# Patient Record
Sex: Female | Born: 1980 | Race: Black or African American | Hispanic: No | Marital: Married | Smoking: Never smoker
Health system: Southern US, Community
[De-identification: ages and names within clinical notes are randomized; demographics above are authoritative.]

## PROBLEM LIST (undated history)

## (undated) DIAGNOSIS — M722 Plantar fascial fibromatosis: Secondary | ICD-10-CM

## (undated) DIAGNOSIS — H539 Unspecified visual disturbance: Secondary | ICD-10-CM

## (undated) DIAGNOSIS — R519 Headache, unspecified: Secondary | ICD-10-CM

## (undated) DIAGNOSIS — R51 Headache: Secondary | ICD-10-CM

## (undated) DIAGNOSIS — G35 Multiple sclerosis: Secondary | ICD-10-CM

## (undated) HISTORY — PX: OTHER SURGICAL HISTORY: SHX169

## (undated) HISTORY — PX: FINGER SURGERY: SHX640

## (undated) HISTORY — DX: Headache, unspecified: R51.9

## (undated) HISTORY — PX: GASTRIC BYPASS: SHX52

## (undated) HISTORY — DX: Unspecified visual disturbance: H53.9

## (undated) HISTORY — DX: Headache: R51

## (undated) HISTORY — DX: Multiple sclerosis: G35

---

## 2007-07-27 HISTORY — PX: TUBAL LIGATION: SHX77

## 2016-10-06 ENCOUNTER — Other Ambulatory Visit: Payer: Self-pay | Admitting: Nurse Practitioner

## 2016-10-06 DIAGNOSIS — N63 Unspecified lump in unspecified breast: Secondary | ICD-10-CM

## 2016-10-11 ENCOUNTER — Other Ambulatory Visit: Payer: Self-pay

## 2016-12-16 ENCOUNTER — Other Ambulatory Visit: Payer: Self-pay | Admitting: Family

## 2016-12-16 ENCOUNTER — Other Ambulatory Visit: Payer: Self-pay | Admitting: Nurse Practitioner

## 2016-12-16 DIAGNOSIS — N63 Unspecified lump in unspecified breast: Secondary | ICD-10-CM

## 2016-12-21 ENCOUNTER — Other Ambulatory Visit: Payer: Self-pay

## 2016-12-21 ENCOUNTER — Ambulatory Visit
Admission: RE | Admit: 2016-12-21 | Discharge: 2016-12-21 | Disposition: A | Discharge status: Home / Self Care | Payer: Medicaid Other | Source: Ambulatory Visit | Attending: Family | Admitting: Family

## 2016-12-21 DIAGNOSIS — N63 Unspecified lump in unspecified breast: Secondary | ICD-10-CM

## 2017-05-19 ENCOUNTER — Encounter (HOSPITAL_COMMUNITY): Payer: Self-pay | Admitting: *Deleted

## 2017-05-19 ENCOUNTER — Emergency Department (HOSPITAL_COMMUNITY)
Admission: EM | Admit: 2017-05-19 | Discharge: 2017-05-19 | Disposition: A | Discharge status: Home / Self Care | Payer: Medicaid Other | Attending: Emergency Medicine | Admitting: Emergency Medicine

## 2017-05-19 ENCOUNTER — Emergency Department (HOSPITAL_COMMUNITY): Payer: Medicaid Other

## 2017-05-19 DIAGNOSIS — R200 Anesthesia of skin: Secondary | ICD-10-CM | POA: Diagnosis present

## 2017-05-19 DIAGNOSIS — R202 Paresthesia of skin: Secondary | ICD-10-CM | POA: Diagnosis not present

## 2017-05-19 DIAGNOSIS — R9389 Abnormal findings on diagnostic imaging of other specified body structures: Secondary | ICD-10-CM | POA: Insufficient documentation

## 2017-05-19 HISTORY — DX: Plantar fascial fibromatosis: M72.2

## 2017-05-19 LAB — COMPREHENSIVE METABOLIC PANEL
ALK PHOS: 53 U/L (ref 38–126)
ALT: 10 U/L — ABNORMAL LOW (ref 14–54)
ANION GAP: 6 (ref 5–15)
AST: 14 U/L — ABNORMAL LOW (ref 15–41)
Albumin: 3.9 g/dL (ref 3.5–5.0)
BILIRUBIN TOTAL: 0.7 mg/dL (ref 0.3–1.2)
BUN: 17 mg/dL (ref 6–20)
CALCIUM: 9.2 mg/dL (ref 8.9–10.3)
CO2: 24 mmol/L (ref 22–32)
Chloride: 110 mmol/L (ref 101–111)
Creatinine, Ser: 0.89 mg/dL (ref 0.44–1.00)
GFR calc non Af Amer: >60 mL/min (ref >60)
GLUCOSE: 83 mg/dL (ref 65–99)
Potassium: 3.9 mmol/L (ref 3.5–5.1)
Sodium: 140 mmol/L (ref 135–145)
Total Protein: 7.5 g/dL (ref 6.5–8.1)

## 2017-05-19 LAB — CBC
HEMATOCRIT: 36.1 % (ref 36.0–46.0)
HEMOGLOBIN: 12.3 g/dL (ref 12.0–15.0)
MCH: 30.8 pg (ref 26.0–34.0)
MCHC: 34.1 g/dL (ref 30.0–36.0)
MCV: 90.3 fL (ref 78.0–100.0)
Platelets: 272 10*3/uL (ref 150–400)
RBC: 4 MIL/uL (ref 3.87–5.11)
RDW: 12.5 % (ref 11.5–15.5)
WBC: 5.5 10*3/uL (ref 4.0–10.5)

## 2017-05-19 MED ORDER — PREDNISONE 50 MG PO TABS: 200.0000 mg | ORAL_TABLET | Freq: Every day | ORAL | 0 refills | Status: AC

## 2017-05-19 MED ORDER — GADOBENATE DIMEGLUMINE 529 MG/ML IV SOLN
20.0000 mL | Freq: Once | INTRAVENOUS | Status: AC
  Administered 2017-05-19: 20 mL via INTRAVENOUS

## 2017-05-19 MED ORDER — PREDNISONE 20 MG PO TABS
200.0000 mg | ORAL_TABLET | ORAL | Status: AC
  Administered 2017-05-19: 200 mg via ORAL
  Filled 2017-05-19: qty 10

## 2017-05-19 NOTE — ED Triage Notes (Signed)
To ED for eval of right side of face and tongue numb since Monday. Feels same today as it when started Monday. Pt has no noted neuro deficits. Does complain of generalized slight HA that started this am. Extremities all strong. Speech is clear. No tongue swelling noted

## 2017-05-19 NOTE — Discharge Instructions (Signed)
As discussed, your abnormal MRI suggests the possibility of multiple sclerosis. It is very important to take all medication as directed, and be sure to follow-up with our neurologist.  Return here for concerning changes in your condition.

## 2017-05-19 NOTE — ED Provider Notes (Signed)
[facility] EMERGENCY DEPARTMENT Provider Note   CSN: [id number] Arrival date & time: 05/19/17  0848     History   Chief Complaint Chief Complaint  Patient presents with  . Numbness    HPI Haley Mitchell is a 36 y.o. female.  Patient c/o right face and right tongue numbness for the past 3 days. Symptoms persistent, constant. No specific exacerbating or alleviating factors. Also feels as if her sense of taste on right side of tongue also affected.  Denies facial pain or weakness, no droop. No ear pain or change in hearing. No headaches. No neck pain. Denies hx same symptoms. No change in speech or vision. No extremity numbness/weakness, or change in normal functional ability.    The history is provided by the patient.    Past Medical History:  Diagnosis Date  . Plantar fasciitis     There are no active problems to display for this patient.   Past Surgical History:  Procedure Laterality Date  . c section    . GASTRIC BYPASS      OB History    No data available       Home Medications    Prior to Admission medications   Medication Sig Start Date End Date Taking? Authorizing Provider  ibuprofen (ADVIL,MOTRIN) 800 MG tablet Take 800 mg by mouth every 8 (eight) hours as needed for mild pain.   Yes [provider]    Family History No family history on file.  Social History Social History  Substance Use Topics  . Smoking status: Not on file  . Smokeless tobacco: Not on file  . Alcohol use Not on file     Allergies   Patient has no known allergies.   Review of Systems Review of Systems  Constitutional: Negative for fever.  HENT: Negative for trouble swallowing.   Eyes: Negative for visual disturbance.  Respiratory: Negative for shortness of breath.   Cardiovascular: Negative for chest pain.  Gastrointestinal: Negative for abdominal pain.  Genitourinary: Negative for flank pain.  Musculoskeletal: Negative for neck pain.    Skin: Negative for rash.  Neurological: Negative for headaches.  Hematological: Does not bruise/bleed easily.  Psychiatric/Behavioral: Negative for confusion.     Physical Exam Updated Vital Signs BP 139/88   Pulse 64   Temp 98.1 F (36.7 C) (Oral)   Resp 13   Ht 1.524 m (5')   Wt 103.9 kg (229 lb)   SpO2 100%   BMI 44.72 kg/m   Physical Exam  Constitutional: She is oriented to person, place, and time. She appears well-developed and well-nourished. No distress.  HENT:  Right Ear: External ear normal.  Left Ear: External ear normal.  Mouth/Throat: Oropharynx is clear and moist.  Eyes: Pupils are equal, round, and reactive to light. Conjunctivae and EOM are normal. No scleral icterus.  Neck: Neck supple. No tracheal deviation present.  No bruits.   Cardiovascular: Normal rate, regular rhythm, normal heart sounds and intact distal pulses.  Exam reveals no gallop and no friction rub.   No murmur heard. Pulmonary/Chest: Effort normal and breath sounds normal. No respiratory distress.  Abdominal: Soft. Normal appearance and bowel sounds are normal. She exhibits no distension. There is no tenderness.  Musculoskeletal: She exhibits no edema.  Neurological: She is alert and oriented to person, place, and time. No cranial nerve deficit.  No facial asymmetry or droop. No cr n deficit noted. No pronator drift. Motor 5/5 bil. sens grossly intact. Steady gait.  Skin: Skin is warm and dry. No rash noted. She is not diaphoretic.  Psychiatric: She has a normal mood and affect.  Nursing note and vitals reviewed.    ED Treatments / Results  Labs (all labs ordered are listed, but only abnormal results are displayed) Results for orders placed or performed during the hospital encounter of 05/19/17  CBC  Result Value Ref Range   WBC 5.5 4.0 - 10.5 K/uL   RBC 4.00 3.87 - 5.11 MIL/uL   Hemoglobin 12.3 12.0 - 15.0 g/dL   HCT 16.136.1 09.636.0 - 04.546.0 %   MCV 90.3 78.0 - 100.0 fL   MCH 30.8 26.0  - 34.0 pg   MCHC 34.1 30.0 - 36.0 g/dL   RDW 40.912.5 81.111.5 - 91.415.5 %   Platelets 272 150 - 400 K/uL  Comprehensive metabolic panel  Result Value Ref Range   Sodium 140 135 - 145 mmol/L   Potassium 3.9 3.5 - 5.1 mmol/L   Chloride 110 101 - 111 mmol/L   CO2 24 22 - 32 mmol/L   Glucose, Bld 83 65 - 99 mg/dL   BUN 17 6 - 20 mg/dL   Creatinine, Ser 7.820.89 0.44 - 1.00 mg/dL   Calcium 9.2 8.9 - 95.610.3 mg/dL   Total Protein 7.5 6.5 - 8.1 g/dL   Albumin 3.9 3.5 - 5.0 g/dL   AST 14 (L) 15 - 41 U/L   ALT 10 (L) 14 - 54 U/L   Alkaline Phosphatase 53 38 - 126 U/L   Total Bilirubin 0.7 0.3 - 1.2 mg/dL   GFR calc non Af Amer >60 >60 mL/min   GFR calc Af Amer >60 >60 mL/min   Anion gap 6 5 - 15    EKG  EKG Interpretation None       Radiology No results found.  Procedures Procedures (including critical care time)  Medications Ordered in ED Medications - No data to display   Initial Impression / Assessment and Plan / ED Course  I have reviewed the triage vital signs and the nursing notes.  Pertinent labs & imaging results that were available during my care of the patient were reviewed by me and considered in my medical decision making (see chart for details).  Labs sent from triage.   Neurology consulted.   Reviewed nursing notes and prior charts for additional history.   Neurology consulted - discussed pt with Dr Amada JupiterKirkpatrick - he recommends getting MRI with/without, and if ok, to have patient f/u with neurology as outpatient.  Pt informed of plan.  Pt will be signed out to Dr Jeraldine LootsLockwood to check MRI when back.  Final Clinical Impressions(s) / ED Diagnoses   Final diagnoses:  None    New Prescriptions New Prescriptions   No medications on file     Cathren LaineSteinl, Dmario Russom, MD 05/19/17 1528

## 2017-05-19 NOTE — ED Provider Notes (Signed)
I reviewed the patient's MRI results with our neurologist, and then with the patient. She is awake and alert, appreciates the information. No new complaints. She will be started on steroids, per neurology recommendation, follow-up in the clinic.    Gerhard Munch, MD 05/19/17 [phone]

## 2017-05-19 NOTE — ED Notes (Signed)
Patient transported to MRI 

## 2017-06-21 ENCOUNTER — Other Ambulatory Visit: Payer: Self-pay

## 2017-06-21 ENCOUNTER — Encounter: Payer: Self-pay | Admitting: Neurology

## 2017-06-21 ENCOUNTER — Encounter (INDEPENDENT_AMBULATORY_CARE_PROVIDER_SITE_OTHER): Payer: Self-pay

## 2017-06-21 ENCOUNTER — Ambulatory Visit: Payer: Medicaid Other | Admitting: Neurology

## 2017-06-21 VITALS — BP 151/95 | HR 72 | Resp 18 | Ht 60.0 in | Wt 238.0 lb

## 2017-06-21 DIAGNOSIS — R9089 Other abnormal findings on diagnostic imaging of central nervous system: Secondary | ICD-10-CM | POA: Diagnosis not present

## 2017-06-21 DIAGNOSIS — Z9884 Bariatric surgery status: Secondary | ICD-10-CM

## 2017-06-21 DIAGNOSIS — R200 Anesthesia of skin: Secondary | ICD-10-CM | POA: Diagnosis not present

## 2017-06-21 DIAGNOSIS — G2581 Restless legs syndrome: Secondary | ICD-10-CM | POA: Insufficient documentation

## 2017-06-21 MED ORDER — GABAPENTIN 600 MG PO TABS: ORAL_TABLET | ORAL | 5 refills | Status: DC

## 2017-06-21 NOTE — Progress Notes (Signed)
[facility]  PATIENT: Haley Mitchell DOB: Sep 16, 1980  REFERRING DOCTOR OR PCP:  Catalina Pizzaynthia Warden, FNP SOURCE: Patient, notes from Ms. Myrtie SomanWarden and ED,  MRI report and Haley SkillernMRI images on PACS.  _________________________________   HISTORICAL  CHIEF COMPLAINT:  Chief Complaint  Patient presents with  . Numbness    Haley Mitchell is here with her husband Reita ClicheBobby for eval of right sided facial, tongue, lip numbness onset in October.  Lasted about 10 days and resolved after course of oral steroids.  Numbness has not returned. Also c/o intermittent tingling bilat legs (left worse than right) onset 2 yrs. ago, only noted when her legs are still, such as in bed at night.  Tingling improves with movement/fim    HISTORY OF PRESENT ILLNESS:  I had the pleasure of seeing you patient, Haley Mitchell, at the [facility] for neurologic consultation regarding her recent episode of facial numbness and her abnormal MRI worrisome for multiple sclerosis.  Around 05/16/2017, she woke up with numbness and side of the face, tongue and lip. She presented to the emergency room 05/19/2017. There was no weakness but words were slightly slurred.    No numbness, weakness or clumsiness elsewhere.    An MRI of the brain, that I personally reviewed, was performed showing about a half dozen T2/FLAIR hyperintense foci, 3 of which were periventricular, radially oriented to the ventricles.   None of the foci enhanced after contrast.   She was prescribed prednisone pills. Over the next week the numbness resolved. She does not have any facial numbness now.  In retrospect, about 2 years ago, while she was living in [place], she had the onset of numbness and stiffness with spasms in the left leg. At that time, there were no symptoms in the right leg. Over a couple weeks the symptoms improved but she continues to get some intermittent numbness. More recently she has noted numbness and  restlessness in her legs when she lays down at night. If she moves around the symptoms improve.  In 2017, she had the vertical sleeve bariatric operation. She lost 65 pounds quickly. Of note, the episode of numbness preceded her operation.   She notes no problems with her vision or her bladder.     She notes fatigue but this has been a problem x years.   She sleeps poorly some nights, sometimes because her legs are numb.   She denies any mood issues or problems with cognition.  She denies any family history of multiple sclerosis. Her mother has psoriasis.   REVIEW OF SYSTEMS: Constitutional: No fevers, chills, sweats, or change in appetite.  She has insomnia and probable restless leg syndrome. Eyes: No visual changes, double vision, eye pain Ear, nose and throat: No hearing loss, ear pain, nasal congestion, sore throat Cardiovascular: No chest pain, palpitations Respiratory: No shortness of breath at rest or with exertion.   No wheezes GastrointestinaI: No nausea, vomiting, diarrhea, abdominal pain, fecal incontinence Genitourinary: No dysuria, urinary retention or frequency.  No nocturia. Musculoskeletal: No neck pain, back pain Integumentary: No rash, pruritus, skin lesions Neurological: as above Psychiatric: No depression at this time.  No anxiety Endocrine: No palpitations, diaphoresis, change in appetite, change in weigh or increased thirst Hematologic/Lymphatic: No anemia, purpura, petechiae. Allergic/Immunologic: No itchy/runny eyes, nasal congestion, recent allergic reactions, rashes  ALLERGIES: No Known Allergies  HOME MEDICATIONS:  Current Outpatient Medications:  .  ibuprofen (ADVIL,MOTRIN) 800 MG tablet, Take 800 mg by mouth every 8 (eight)  hours as needed for mild pain., Disp: , Rfl:   PAST MEDICAL HISTORY: Past Medical History:  Diagnosis Date  . Plantar fasciitis     PAST SURGICAL HISTORY: Past Surgical History:  Procedure Laterality Date  . c section      . GASTRIC BYPASS      FAMILY HISTORY: History reviewed. No pertinent family history.  SOCIAL HISTORY:  Social History   Socioeconomic History  . Marital status: Married    Spouse name: Not on file  . Number of children: Not on file  . Years of education: Not on file  . Highest education level: Not on file  Social Needs  . Financial resource strain: Not on file  . Food insecurity - worry: Not on file  . Food insecurity - inability: Not on file  . Transportation needs - medical: Not on file  . Transportation needs - non-medical: Not on file  Occupational History  . Not on file  Tobacco Use  . Smoking status: Never Smoker  . Smokeless tobacco: Never Used  Substance and Sexual Activity  . Alcohol use: Yes    Comment: social  . Drug use: No  . Sexual activity: Not on file  Other Topics Concern  . Not on file  Social History Narrative  . Not on file     PHYSICAL EXAM  Vitals:   06/21/17 0922  BP: (!) 151/95  Pulse: 72  Resp: 18  Weight: 238 lb (108 kg)  Height: 5' (1.524 m)    Body mass index is 46.48 kg/m.   General: The patient is well-developed and well-nourished and in no acute distress  Eyes:  Funduscopic exam shows normal optic discs and retinal vessels.  Neck: The neck is supple, no carotid bruits are noted.  The neck is nontender.  Cardiovascular: The heart has a regular rate and rhythm with a normal S1 and S2. There were no murmurs, gallops or rubs. Lungs are clear to auscultation.  Skin: Extremities are without significant edema.  Musculoskeletal:  Back is nontender  Neurologic Exam  Mental status: The patient is alert and oriented x 3 at the time of the examination. The patient has apparent normal recent and remote memory, with an apparently normal attention span and concentration ability.   Speech is normal.  Cranial nerves: Extraocular movements are full. Pupils are equal, round, and reactive to light and accomodation.  Visual fields are  full.  Facial symmetry is present. There is good facial sensation to soft touch bilaterally.Facial strength is normal.  Trapezius and sternocleidomastoid strength is normal. No dysarthria is noted.  The tongue is midline, and the patient has symmetric elevation of the soft palate. No obvious hearing deficits are noted.  Motor:  Muscle bulk is normal.   Tone is normal. Strength is  5 / 5 in all 4 extremities.   Sensory: In the arms, she had symmetric sensation to touch, temperature and vibration. The legs, she had intact and symmetric sensation to touch and temperature. However, vibration sensation was altered on the left..  Coordination: Cerebellar testing reveals good finger-nose-finger and heel-to-shin bilaterally.  Gait and station: Station is normal.   Gait is normal. Tandem gait is normal. Romberg is negative.   Reflexes: Deep tendon reflexes are symmetric and normal bilaterally.   Plantar responses are flexor.    DIAGNOSTIC DATA (LABS, IMAGING, TESTING) - I reviewed patient records, labs, notes, testing and imaging myself where available.  Lab Results  Component Value Date   WBC 5.5  05/19/2017   HGB 12.3 05/19/2017   HCT 36.1 05/19/2017   MCV 90.3 05/19/2017   PLT 272 05/19/2017      Component Value Date/Time   NA 140 05/19/2017 0950   K 3.9 05/19/2017 0950   CL 110 05/19/2017 0950   CO2 24 05/19/2017 0950   GLUCOSE 83 05/19/2017 0950   BUN 17 05/19/2017 0950   CREATININE 0.89 05/19/2017 0950   CALCIUM 9.2 05/19/2017 0950   PROT 7.5 05/19/2017 0950   ALBUMIN 3.9 05/19/2017 0950   AST 14 (L) 05/19/2017 0950   ALT 10 (L) 05/19/2017 0950   ALKPHOS 53 05/19/2017 0950   BILITOT 0.7 05/19/2017 0950   GFRNONAA >60 05/19/2017 0950   GFRAA >60 05/19/2017 0950       ASSESSMENT AND PLAN  Facial numbness - Plan: Ferritin, Pan-ANCA  Left leg numbness - Plan: MR CERVICAL SPINE W WO CONTRAST, MR THORACIC SPINE W WO CONTRAST, Copper, serum, Vitamin B12, Pan-ANCA  Restless  leg - Plan: Ferritin  Abnormal brain MRI - Plan: MR CERVICAL SPINE W WO CONTRAST, MR THORACIC SPINE W WO CONTRAST, Pan-ANCA, CBC with Differential/Platelet  Status post bariatric surgery - Plan: Copper, serum, Vitamin B12, CBC with Differential/Platelet   In summary, Haley Mitchell is a 36 year old woman had a 7-10 day episode of a facial numbness a couple weeks ago and a 2 week episode of left leg numbness and stiffness 2 years ago who has an abnormal MRI that could be consistent with multiple sclerosis. There were no enhancing lesions. Therefore, we need to obtain more information to determine if she actually has multiple sclerosis. I will check an MRI of the cervical and thoracic spine. If she has plaques within the spinal cord, then we can be of the diagnosis. If the spinal MRI is negative, we will need to check a lumbar puncture to see if the CSF has oligoclonal bands. If present, then MS can also be diagnosed.  If both tests are negative, then we will recheck her MRI in about 9 months to see if she has changes over time consistent with MS. We will also check some blood work today to make sure that there is not a metabolic reason for her symptoms (she has had bariatric procedures).  She also appears to have restless leg syndrome and I will check a ferritin level and have her start gabapentin about an hour before bedtime.  She will return to see me in 6 weeks or sooner if there are new or worsening symptoms.   If the MRI of the spinal cord is consistent with MS, then I will bring her in sooner to initiate therapy   Abhijay Morriss A. Epimenio Foot, MD, [facility] 06/21/2017, 9:24 AM Certified in Neurology, Clinical Neurophysiology, Sleep Medicine, Pain Medicine and Neuroimaging  [facility] [address], [address] [place], [place] [address] [phone]

## 2017-06-22 LAB — CBC WITH DIFFERENTIAL/PLATELET
BASOS ABS: 0 10*3/uL (ref 0.0–0.2)
BASOS: 0 %
EOS (ABSOLUTE): 0.1 10*3/uL (ref 0.0–0.4)
EOS: 1 %
HEMATOCRIT: 39.5 % (ref 34.0–46.6)
HEMOGLOBIN: 12.8 g/dL (ref 11.1–15.9)
IMMATURE GRANS (ABS): 0 10*3/uL (ref 0.0–0.1)
Immature Granulocytes: 0 %
LYMPHS ABS: 2 10*3/uL (ref 0.7–3.1)
Lymphs: 34 %
MCH: 29.6 pg (ref 26.6–33.0)
MCHC: 32.4 g/dL (ref 31.5–35.7)
MCV: 91 fL (ref 79–97)
MONOCYTES: 5 %
Monocytes Absolute: 0.3 10*3/uL (ref 0.1–0.9)
Neutrophils Absolute: 3.5 10*3/uL (ref 1.4–7.0)
Neutrophils: 60 %
Platelets: 315 10*3/uL (ref 150–379)
RBC: 4.32 x10E6/uL (ref 3.77–5.28)
RDW: 13 % (ref 12.3–15.4)
WBC: 5.9 10*3/uL (ref 3.4–10.8)

## 2017-06-23 LAB — PAN-ANCA
Atypical pANCA: <1:20 {titer}
C-ANCA: <1:20 {titer}
P-ANCA: <1:20 {titer}

## 2017-06-23 LAB — VITAMIN B12: VITAMIN B 12: 853 pg/mL (ref 232–1245)

## 2017-06-23 LAB — FERRITIN: Ferritin: 31 ng/mL (ref 15–150)

## 2017-06-23 LAB — COPPER, SERUM: COPPER: 104 ug/dL (ref 72–166)

## 2017-06-24 ENCOUNTER — Telehealth: Payer: Self-pay | Admitting: *Deleted

## 2017-06-24 NOTE — Telephone Encounter (Signed)
LMOM that lab work done in our office is fine.  She does not need to return this call unless she has questions/fim 

## 2017-06-24 NOTE — Telephone Encounter (Signed)
-----   Message from Asa Lenteichard A Sater, MD sent at 06/23/2017  5:28 PM EST ----- Please let the patient know that the lab work is fine.

## 2017-06-27 ENCOUNTER — Telehealth: Payer: Self-pay | Admitting: Neurology

## 2017-06-27 NOTE — Telephone Encounter (Signed)
Pt called she said insurance has denied gabapentin (NEURONTIN) 600 MG tablet . She said preferred medication has not been tried and failed. She is wanting to what is the next step. Please call to advise

## 2017-06-28 NOTE — Telephone Encounter (Signed)
Per Medicaid, Gabapentin tablets were just added to their list of preferred medications on 06/27/17.  The pharmacy just needs to run the rx. again and she should be able to pick it up.  She verbalized understanding of same./fim

## 2017-07-03 ENCOUNTER — Inpatient Hospital Stay: Admission: RE | Admit: 2017-07-03 | Payer: Medicaid Other | Source: Ambulatory Visit

## 2017-07-03 ENCOUNTER — Other Ambulatory Visit: Payer: Medicaid Other

## 2017-07-10 ENCOUNTER — Ambulatory Visit
Admission: RE | Admit: 2017-07-10 | Discharge: 2017-07-10 | Disposition: A | Discharge status: Home / Self Care | Payer: Medicaid Other | Source: Ambulatory Visit | Attending: Neurology | Admitting: Neurology

## 2017-07-10 DIAGNOSIS — R9089 Other abnormal findings on diagnostic imaging of central nervous system: Secondary | ICD-10-CM

## 2017-07-10 DIAGNOSIS — R200 Anesthesia of skin: Secondary | ICD-10-CM

## 2017-07-10 MED ORDER — GADOBENATE DIMEGLUMINE 529 MG/ML IV SOLN: 20.0000 mL | Freq: Once | INTRAVENOUS | Status: DC | PRN

## 2017-07-14 ENCOUNTER — Telehealth: Payer: Self-pay | Admitting: *Deleted

## 2017-07-14 NOTE — Telephone Encounter (Signed)
-----   Message from Asa Lente, MD sent at 07/13/2017  4:58 PM EST ----- Please let her know that I looked at the MRI of the spine. She does appear to have 1 or 2 plaques (definite focus at T1 to the left and small subtle focus at C3-C4 to the right) within the spinal cord consistent with MS. Combined with the abnormal brain MRI, we can now be fairly certain of the diagnosis and I performed her to start a disease modifying therapy. Please have her come in the first week of January to see me.

## 2017-07-14 NOTE — Telephone Encounter (Signed)
Spoke with pt. and reviewed below MRI results; sched. appt. with RAS to discuss tx. options/fim

## 2017-08-02 ENCOUNTER — Encounter: Payer: Self-pay | Admitting: *Deleted

## 2017-08-02 ENCOUNTER — Encounter: Payer: Self-pay | Admitting: Neurology

## 2017-08-02 ENCOUNTER — Ambulatory Visit: Payer: Medicaid Other | Admitting: Neurology

## 2017-08-02 VITALS — BP 136/89 | HR 67 | Ht 60.0 in | Wt 242.0 lb

## 2017-08-02 DIAGNOSIS — R200 Anesthesia of skin: Secondary | ICD-10-CM

## 2017-08-02 DIAGNOSIS — G2581 Restless legs syndrome: Secondary | ICD-10-CM

## 2017-08-02 DIAGNOSIS — G35 Multiple sclerosis: Secondary | ICD-10-CM | POA: Diagnosis not present

## 2017-08-02 NOTE — Progress Notes (Addendum)
[facility]  PATIENT: Haley Mitchell DOB: 06/18/1981  REFERRING DOCTOR OR PCP:  Catalina Pizza, FNP SOURCE: Patient, notes from Ms. Haley Mitchell and ED,  MRI report and MRI images on PACS.  _________________________________   HISTORICAL  CHIEF COMPLAINT:  Chief Complaint  Patient presents with  . Numbness    Pt still has numbness off and on, but it is not any worse.     HISTORY OF PRESENT ILLNESS:  Haley Mitchell is a 37 year old woman who was diagnosed with MS in December 2018  Update to 08/02/2017: Since the last visit, she has had MRIs of the spine. It shows a definite plaque to the left at T1 and a more subtle focus to the right at C3-C4, both consistent with demyelinating lesions as would be seen with multiple sclerosis.  The focus at T1 to the left can explain the symptoms that occurred in her left leg 2 years ago. In retrospect she also recalls one year ago having some heaviness in the right arm for about a week. In October 2018 she had a definite exacerbation with numbness and Combined with her symptoms and her abnormal brain MRI, we can be fairly certain of the diagnosis of multiple sclerosis and she meets McDonald criteria.   She is here today to discuss the possible DMTs.  We had a long discussion about treatment options for her newly diagnosed MS going over efficacy, safety and tolerability.. Her presentation is of medium aggressiveness and she would be a candidate for many different medications. Due to the long safety records of the interferons, she would prefer to start with Rebif. She will consider one of the oral agents or one of the IV medications if she has breakthrough activity.  From 06/21/2017: Around 05/16/2017 she woke up with numbness and side of the face, tongue and lip. She presented to the emergency room 05/19/2017. There was no weakness but words were slightly slurred.    No numbness, weakness or clumsiness elsewhere.    An MRI of the brain,  that I personally reviewed, was performed showing about a half dozen T2/FLAIR hyperintense foci, 3 of which were periventricular, radially oriented to the ventricles.   None of the foci enhanced after contrast.   She was prescribed prednisone pills. Over the next week the numbness resolved. She does not have any facial numbness now.  In retrospect, about 2 years ago, while she was living in [place], she had the onset of numbness and stiffness with spasms in the left leg. At that time, there were no symptoms in the right leg. Over a couple weeks the symptoms improved but she continues to get some intermittent numbness. More recently she has noted numbness and restlessness in her legs when she lays down at night. If she moves around the symptoms improve.  In 2017, she had the vertical sleeve bariatric operation. She lost 65 pounds quickly. Of note, the episode of numbness preceded her operation.   She notes no problems with her vision or her bladder.     She notes fatigue but this has been a problem x years.   She sleeps poorly some nights, sometimes because her legs are numb.   She denies any mood issues or problems with cognition.  She denies any family history of multiple sclerosis. Her mother has psoriasis.   REVIEW OF SYSTEMS: Constitutional: No fevers, chills, sweats, or change in appetite.  She has insomnia and probable restless leg syndrome. Eyes: No visual changes, double vision, eye pain  Ear, nose and throat: No hearing loss, ear pain, nasal congestion, sore throat Cardiovascular: No chest pain, palpitations Respiratory: No shortness of breath at rest or with exertion.   No wheezes GastrointestinaI: No nausea, vomiting, diarrhea, abdominal pain, fecal incontinence Genitourinary: No dysuria, urinary retention or frequency.  No nocturia. Musculoskeletal: No neck pain, back pain Integumentary: No rash, pruritus, skin lesions Neurological: as above Psychiatric: No depression at  this time.  No anxiety Endocrine: No palpitations, diaphoresis, change in appetite, change in weigh or increased thirst Hematologic/Lymphatic: No anemia, purpura, petechiae. Allergic/Immunologic: No itchy/runny eyes, nasal congestion, recent allergic reactions, rashes  ALLERGIES: No Known Allergies  HOME MEDICATIONS:  Current Outpatient Medications:  .  gabapentin (NEURONTIN) 600 MG tablet, 1/2 to 1 po qHS, Disp: 30 tablet, Rfl: 5 .  ibuprofen (ADVIL,MOTRIN) 800 MG tablet, Take 800 mg by mouth every 8 (eight) hours as needed for mild pain., Disp: , Rfl:  .  naproxen (NAPROSYN) 500 MG tablet, Take 500 mg by mouth 2 (two) times daily., Disp: , Rfl: 0 .  Interferon Beta-1a (REBIF REBIDOSE) 44 MCG/0.5ML SOAJ, Inject 44 mcg into the skin 3 (three) times a week., Disp: , Rfl:   PAST MEDICAL HISTORY: Past Medical History:  Diagnosis Date  . Headache   . Plantar fasciitis   . Vision abnormalities     PAST SURGICAL HISTORY: Past Surgical History:  Procedure Laterality Date  . c section    . GASTRIC BYPASS      FAMILY HISTORY: Family History  Problem Relation Age of Onset  . Hypertension Mother   . Heart disease Mother     SOCIAL HISTORY:  Social History   Socioeconomic History  . Marital status: Married    Spouse name: Not on file  . Number of children: Not on file  . Years of education: Not on file  . Highest education level: Not on file  Social Needs  . Financial resource strain: Not on file  . Food insecurity - worry: Not on file  . Food insecurity - inability: Not on file  . Transportation needs - medical: Not on file  . Transportation needs - non-medical: Not on file  Occupational History  . Not on file  Tobacco Use  . Smoking status: Never Smoker  . Smokeless tobacco: Never Used  Substance and Sexual Activity  . Alcohol use: Yes    Comment: social  . Drug use: No  . Sexual activity: Not on file  Other Topics Concern  . Not on file  Social History  Narrative  . Not on file     PHYSICAL EXAM  Vitals:   08/02/17 0929  BP: 136/89  Pulse: 67  Weight: 242 lb (109.8 kg)  Height: 5' (1.524 m)    Body mass index is 47.26 kg/m.   General: The patient is well-developed and well-nourished and in no acute distress  Eyes:  Funduscopic exam shows normal optic discs and retinal vessels.  Neck: The neck is supple, no carotid bruits are noted.  The neck is nontender.  Cardiovascular: The heart has a regular rate and rhythm with a normal S1 and S2. There were no murmurs, gallops or rubs. Lungs are clear to auscultation.  Skin: Extremities are without significant edema.  Musculoskeletal:  Back is nontender  Neurologic Exam  Mental status: The patient is alert and oriented x 3 at the time of the examination. The patient has apparent normal recent and remote memory, with an apparently normal attention span and concentration ability.  Speech is normal.  Cranial nerves: Extraocular movements are full. Pupils are equal, round, and reactive to light and accomodation.  Visual fields are full.  Facial symmetry is present. There is good facial sensation to soft touch bilaterally.Facial strength is normal.  Trapezius and sternocleidomastoid strength is normal. No dysarthria is noted.  The tongue is midline, and the patient has symmetric elevation of the soft palate. No obvious hearing deficits are noted.  Motor:  Muscle bulk is normal.   Tone is normal. Strength is  5 / 5 in all 4 extremities.   Sensory: In the arms, she had symmetric sensation to touch, temperature and vibration. The legs, she had intact and symmetric sensation to touch and temperature. However, vibration sensation was altered on the left..  Coordination: Cerebellar testing reveals good finger-nose-finger and heel-to-shin bilaterally.  Gait and station: Station is normal.   Gait is normal. Tandem gait is normal. Romberg is negative.   Reflexes: Deep tendon reflexes are  symmetric and normal bilaterally.   Plantar responses are flexor.    DIAGNOSTIC DATA (LABS, IMAGING, TESTING) - I reviewed patient records, labs, notes, testing and imaging myself where available.  Lab Results  Component Value Date   WBC 5.9 06/21/2017   HGB 12.8 06/21/2017   HCT 39.5 06/21/2017   MCV 91 06/21/2017   PLT 315 06/21/2017      Component Value Date/Time   NA 140 05/19/2017 0950   K 3.9 05/19/2017 0950   CL 110 05/19/2017 0950   CO2 24 05/19/2017 0950   GLUCOSE 83 05/19/2017 0950   BUN 17 05/19/2017 0950   CREATININE 0.89 05/19/2017 0950   CALCIUM 9.2 05/19/2017 0950   PROT 7.5 05/19/2017 0950   ALBUMIN 3.9 05/19/2017 0950   AST 14 (L) 05/19/2017 0950   ALT 10 (L) 05/19/2017 0950   ALKPHOS 53 05/19/2017 0950   BILITOT 0.7 05/19/2017 0950   GFRNONAA >60 05/19/2017 0950   GFRAA >60 05/19/2017 0950       ASSESSMENT AND PLAN  No diagnosis found.   1.    She has newly diagnosed multiple sclerosis and we discussed the need to get on a disease modifying therapy. We discussed Rebif, Copaxone, Aubagio, Tecfidera and Gilenya. She does not have a lot of aggressiveness and the IV medications were mentioned but not discussed in detail. She is most interested Rebif as it has a long safety history and she does not mind to be herself shots.    Blood count and liver function tests were performed in the past few months and were normal. 2.    She will return to see me in 3 or 4 months. We discussed getting another MRI later in the year 2 make sure that she is not having breakthrough activity. If she does have breakthrough activity, or if she has another exacerbation, I would consider switching her to one of the higher efficacy oral agents or to the highly efficacious IV medications.  45 minute face-to-face evaluation with greater than one half time counseling correlating care about her new diagnosis of MS. Specifically we discussed disease modifying therapies in detail and also  went over typical symptoms of MS relapses and when to call the doctor. Literature was also provided on MS and the national MS Society.  Edit Ricciardelli A. Epimenio Foot, MD, [facility] 08/02/2017, 6:13 PM Certified in Neurology, Clinical Neurophysiology, Sleep Medicine, Pain Medicine and Neuroimaging  [facility] [address] [place], [place] [address] [phone]

## 2017-08-10 ENCOUNTER — Telehealth: Payer: Self-pay | Admitting: Neurology

## 2017-08-10 NOTE — Telephone Encounter (Signed)
Pt called stating she is having symptoms such as being dizzy for the past 2 days. Please call to discuss further

## 2017-08-10 NOTE — Telephone Encounter (Signed)
Spoke with Haley Mitchell this afternoon.  She c/o intermittent dizziness onset 2 days ago.  Not able to identify a trigger.  Drinking plenty of fluids.  Hasn't been sick with cold or other viral sx..  No fever.  No earache.  Dizziness not triggered by changes in position.  Denies other sx.  Rebif srf was faxed to [facility] on 08/02/17, so she should be able to start this in the next 7-10 days or so.  I explained that, in the absence of other sx, no identifiable cause for dizziness, RAS is not likely to change tx. plan.  If dizziness worsens, or other sx. present, please call back.  I did give Brandt-Daroff exercises.  Pt. verbalized understanding of same, is agreeable with this plan/fim

## 2017-08-11 ENCOUNTER — Telehealth: Payer: Self-pay | Admitting: *Deleted

## 2017-08-11 NOTE — Telephone Encounter (Signed)
PA is still pending for Rebif.

## 2017-08-11 NOTE — Telephone Encounter (Signed)
PA's for Rebif titration pack and maintenance dose completed and, along with office notes, faxed to [facility], fax# [phone]/fim

## 2017-08-11 NOTE — Telephone Encounter (Signed)
PA for Rebif completed and faxed to [facility], fax# [phone].  Pt. is newly dx. MS, has not tried and failed any other medications. As her MS does not appear to be very aggressive at this time, both pt. and RAS prefer she start a medication that carries less risk.  She would like to start Rebif due to it's long safety history/fim

## 2017-08-12 ENCOUNTER — Ambulatory Visit: Payer: Medicaid Other | Admitting: Neurology

## 2017-08-16 ENCOUNTER — Telehealth: Payer: Self-pay | Admitting: *Deleted

## 2017-08-16 NOTE — Telephone Encounter (Signed)
Received fax notification from [facility] specialty pharmacy that a complete benefits investigation performed. Ordered item: Rebif. Primary insurance: [place] [employer], Co-pay amount: $3, PA required, no.  Any questions: phone: [phone].

## 2017-08-29 ENCOUNTER — Other Ambulatory Visit: Payer: Self-pay | Admitting: Neurology

## 2017-10-31 ENCOUNTER — Other Ambulatory Visit: Payer: Self-pay

## 2017-10-31 ENCOUNTER — Ambulatory Visit: Payer: Medicaid Other | Admitting: Neurology

## 2017-10-31 ENCOUNTER — Telehealth: Payer: Self-pay | Admitting: *Deleted

## 2017-10-31 ENCOUNTER — Encounter: Payer: Self-pay | Admitting: Neurology

## 2017-10-31 VITALS — BP 134/88 | HR 91 | Resp 18 | Ht 60.0 in | Wt 245.0 lb

## 2017-10-31 DIAGNOSIS — R200 Anesthesia of skin: Secondary | ICD-10-CM

## 2017-10-31 DIAGNOSIS — G35 Multiple sclerosis: Secondary | ICD-10-CM

## 2017-10-31 DIAGNOSIS — R5383 Other fatigue: Secondary | ICD-10-CM

## 2017-10-31 MED ORDER — ARMODAFINIL 200 MG PO TABS: ORAL_TABLET | ORAL | 5 refills | Status: DC

## 2017-10-31 NOTE — Progress Notes (Signed)
[facility]  PATIENT: Haley Mitchell DOB: [birth year]  REFERRING DOCTOR OR PCP:  Catalina Pizza, FNP SOURCE: Patient, notes from Ms. Myrtie Soman and ED,  MRI report and MRI images on PACS.  _________________________________   HISTORICAL  CHIEF COMPLAINT:  Chief Complaint  Patient presents with  . Multiple Sclerosis    Sts. she is tolerating Rebif with some flu like sx. the day after her injection. Facial numbness, tingling in legs is totally resolved, has not returned.  She feels fatigue is worse/fim    HISTORY OF PRESENT ILLNESS:  Haley Mitchell is a 37 year old woman who was diagnosed with MS in December 2018  Update 10/31/2017: She is tolerating Rebif well.  She has been on x 2 months now (started early February 2000)  She has some flulike symptoms after her shots but these are generally mild.  She has recovered well from her initial exacerbation.  She no longer has tingling in the face or legs.   Her gait is fine and she can climb stairs.   She still stumbles some but no falls.     Bladder function is fine.   The right eye is blurry x 2 weeks.     She is experiencing more fatigue, physical and cognitive.    She falls asleep well but sometimes wakes up and has trouble getting back asleep.   However, even after a good nights sleep she is tired.   She does not have sleepiness.    She denies depression.   She notes that she is mildly forgetful.      Update to 08/02/2017: Since the last visit, she has had MRIs of the spine. It shows a definite plaque to the left at T1 and a more subtle focus to the right at C3-C4, both consistent with demyelinating lesions as would be seen with multiple sclerosis.  The focus at T1 to the left can explain the symptoms that occurred in her left leg 2 years ago. In retrospect she also recalls one year ago having some heaviness in the right arm for about a week. In October 2018 she had a definite exacerbation with numbness and Combined with her  symptoms and her abnormal brain MRI, we can be fairly certain of the diagnosis of multiple sclerosis and she meets McDonald criteria.   She is here today to discuss the possible DMTs.  We had a long discussion about treatment options for her newly diagnosed MS going over efficacy, safety and tolerability.. Her presentation is of medium aggressiveness and she would be a candidate for many different medications. Due to the long safety records of the interferons, she would prefer to start with Rebif. She will consider one of the oral agents or one of the IV medications if she has breakthrough activity.  From 06/21/2017: Around 05/16/2017 she woke up with numbness and side of the face, tongue and lip. She presented to the emergency room 05/19/2017. There was no weakness but words were slightly slurred.    No numbness, weakness or clumsiness elsewhere.    An MRI of the brain, that I personally reviewed, was performed showing about a half dozen T2/FLAIR hyperintense foci, 3 of which were periventricular, radially oriented to the ventricles.   None of the foci enhanced after contrast.   She was prescribed prednisone pills. Over the next week the numbness resolved. She does not have any facial numbness now.  In retrospect, about 2 years ago, while she was living in [place], she had the onset  of numbness and stiffness with spasms in the left leg. At that time, there were no symptoms in the right leg. Over a couple weeks the symptoms improved but she continues to get some intermittent numbness. More recently she has noted numbness and restlessness in her legs when she lays down at night. If she moves around the symptoms improve.  In 2017, she had the vertical sleeve bariatric operation. She lost 65 pounds quickly. Of note, the episode of numbness preceded her operation.   She notes no problems with her vision or her bladder.     She notes fatigue but this has been a problem x years.   She sleeps poorly some  nights, sometimes because her legs are numb.   She denies any mood issues or problems with cognition.  She denies any family history of multiple sclerosis. Her mother has psoriasis.   REVIEW OF SYSTEMS: Constitutional: No fevers, chills, sweats, or change in appetite.  She has insomnia and probable restless leg syndrome. Eyes: No visual changes, double vision, eye pain Ear, nose and throat: No hearing loss, ear pain, nasal congestion, sore throat Cardiovascular: No chest pain, palpitations Respiratory: No shortness of breath at rest or with exertion.   No wheezes GastrointestinaI: No nausea, vomiting, diarrhea, abdominal pain, fecal incontinence Genitourinary: No dysuria, urinary retention or frequency.  No nocturia. Musculoskeletal: No neck pain, back pain Integumentary: No rash, pruritus, skin lesions Neurological: as above Psychiatric: No depression at this time.  No anxiety Endocrine: No palpitations, diaphoresis, change in appetite, change in weigh or increased thirst Hematologic/Lymphatic: No anemia, purpura, petechiae. Allergic/Immunologic: No itchy/runny eyes, nasal congestion, recent allergic reactions, rashes  ALLERGIES: No Known Allergies  HOME MEDICATIONS:  Current Outpatient Medications:  .  gabapentin (NEURONTIN) 600 MG tablet, TAKE HALF TO 1 TABLET BY MOUTH AT BEDTIME, Disp: 30 tablet, Rfl: 2 .  ibuprofen (ADVIL,MOTRIN) 800 MG tablet, Take 800 mg by mouth every 8 (eight) hours as needed for mild pain., Disp: , Rfl:  .  Interferon Beta-1a (REBIF REBIDOSE) 44 MCG/0.5ML SOAJ, Inject 44 mcg into the skin 3 (three) times a week., Disp: , Rfl:  .  naproxen (NAPROSYN) 500 MG tablet, Take 500 mg by mouth 2 (two) times daily., Disp: , Rfl: 0 .  Armodafinil 200 MG TABS, One po qAM, Disp: 30 tablet, Rfl: 5  PAST MEDICAL HISTORY: Past Medical History:  Diagnosis Date  . Headache   . Plantar fasciitis   . Vision abnormalities     PAST SURGICAL HISTORY: Past Surgical  History:  Procedure Laterality Date  . c section    . GASTRIC BYPASS      FAMILY HISTORY: Family History  Problem Relation Age of Onset  . Hypertension Mother   . Heart disease Mother     SOCIAL HISTORY:  Social History   Socioeconomic History  . Marital status: Married    Spouse name: Not on file  . Number of children: Not on file  . Years of education: Not on file  . Highest education level: Not on file  Occupational History  . Not on file  Social Needs  . Financial resource strain: Not on file  . Food insecurity:    Worry: Not on file    Inability: Not on file  . Transportation needs:    Medical: Not on file    Non-medical: Not on file  Tobacco Use  . Smoking status: Never Smoker  . Smokeless tobacco: Never Used  Substance and Sexual Activity  .  Alcohol use: Yes    Comment: social  . Drug use: No  . Sexual activity: Not on file  Lifestyle  . Physical activity:    Days per week: Not on file    Minutes per session: Not on file  . Stress: Not on file  Relationships  . Social connections:    Talks on phone: Not on file    Gets together: Not on file    Attends religious service: Not on file    Active member of club or organization: Not on file    Attends meetings of clubs or organizations: Not on file    Relationship status: Not on file  . Intimate partner violence:    Fear of current or ex partner: Not on file    Emotionally abused: Not on file    Physically abused: Not on file    Forced sexual activity: Not on file  Other Topics Concern  . Not on file  Social History Narrative  . Not on file     PHYSICAL EXAM  Vitals:   10/31/17 1108  BP: 134/88  Pulse: 91  Resp: 18  Weight: 245 lb (111.1 kg)  Height: 5' (1.524 m)    Body mass index is 47.85 kg/m.   General: The patient is well-developed and well-nourished and in no acute distress  Eyes:  Funduscopic exam shows normal optic discs and retinal vessels.   Neurologic Exam  Mental  status: The patient is alert and oriented x 3 at the time of the examination. The patient has apparent normal recent and remote memory, with an apparently normal attention span and concentration ability.   Speech is normal.  Cranial nerves: Extraocular movements are full.  There is a 2+ right APB.  Color vision is reduced on the right.  She has a little more difficulty reading out of the right than the left.  No dysarthria is noted.  The tongue is midline, and the patient has symmetric elevation of the soft palate. No obvious hearing deficits are noted.  Motor:  Muscle bulk is normal.   Tone is normal. Strength is  5 / 5 in all 4 extremities.   Sensory: In the arms, she had symmetric sensation to touch, temperature and vibration. The legs, she had intact and symmetric sensation to touch and temperature and vibration.  Coordination: Cerebellar testing reveals good finger-nose-finger and heel-to-shin bilaterally.  Gait and station: Station is normal.   Gait is normal. Tandem gait is normal. Romberg is negative.   Reflexes: Deep tendon reflexes are symmetric and normal bilaterally.       DIAGNOSTIC DATA (LABS, IMAGING, TESTING) - I reviewed patient records, labs, notes, testing and imaging myself where available.  Lab Results  Component Value Date   WBC 5.9 06/21/2017   HGB 12.8 06/21/2017   HCT 39.5 06/21/2017   MCV 91 06/21/2017   PLT 315 06/21/2017      Component Value Date/Time   NA 140 05/19/2017 0950   K 3.9 05/19/2017 0950   CL 110 05/19/2017 0950   CO2 24 05/19/2017 0950   GLUCOSE 83 05/19/2017 0950   BUN 17 05/19/2017 0950   CREATININE 0.89 05/19/2017 0950   CALCIUM 9.2 05/19/2017 0950   PROT 7.5 05/19/2017 0950   ALBUMIN 3.9 05/19/2017 0950   AST 14 (L) 05/19/2017 0950   ALT 10 (L) 05/19/2017 0950   ALKPHOS 53 05/19/2017 0950   BILITOT 0.7 05/19/2017 0950   GFRNONAA >60 05/19/2017 0950   GFRAA >60  05/19/2017 0950       ASSESSMENT AND PLAN  Multiple sclerosis  (HCC) - Plan: CBC with Differential/Platelet, Hepatic function panel  Facial numbness  Left leg numbness  Other fatigue   1.     She appears to have had a small exacerbation with optic neuritis but this occurred only 6 or 7 weeks after starting the Rebif.  Therefore, we will continue  the Rebif.  Around the time the next visit we will check an MRI of the brain to make sure that she is not having subclinical progression.  If this is occurring, we would need to consider a stronger disease modifying therapy.   Since the optic neuritis started 2 weeks ago, I will hold off on steroids. 2.     Fatigue is doing worse, trial of Nuvigil. 3.    Return to see me in 4 months or sooner if there are new or worsening neurologic symptoms.    Willine Schwalbe A. Epimenio Foot, MD, [facility] 10/31/2017, 12:12 PM Certified in Neurology, Clinical Neurophysiology, Sleep Medicine, Pain Medicine and Neuroimaging  [facility] [address] [place], [place] [address] [phone]

## 2017-10-31 NOTE — Telephone Encounter (Signed)
Brand Nuvigil, Provigil are preferred Medicaid meds.  Rx. changed to Nuvigil 200mg  tablets, #30/30, which still requires a PA.  PA completed via phone with [employer], phone# [phone].  Approved for dates 10/31/17 thru 10/26/18.  PA# [id number].  Interaction ID: [id number].  Faxed response with approval info to pharmacy ([facility] [address], fax# [phone]) advising they should run rx. as brand name Nuvigil/fim

## 2017-11-01 ENCOUNTER — Telehealth: Payer: Self-pay | Admitting: *Deleted

## 2017-11-01 LAB — CBC WITH DIFFERENTIAL/PLATELET
Basophils Absolute: 0 10*3/uL (ref 0.0–0.2)
Basos: 0 %
EOS (ABSOLUTE): 0 10*3/uL (ref 0.0–0.4)
EOS: 1 %
HEMATOCRIT: 37.8 % (ref 34.0–46.6)
HEMOGLOBIN: 12.2 g/dL (ref 11.1–15.9)
Immature Grans (Abs): 0 10*3/uL (ref 0.0–0.1)
Immature Granulocytes: 0 %
LYMPHS ABS: 2.4 10*3/uL (ref 0.7–3.1)
Lymphs: 42 %
MCH: 29.8 pg (ref 26.6–33.0)
MCHC: 32.3 g/dL (ref 31.5–35.7)
MCV: 92 fL (ref 79–97)
MONOCYTES: 5 %
MONOS ABS: 0.3 10*3/uL (ref 0.1–0.9)
NEUTROS ABS: 2.9 10*3/uL (ref 1.4–7.0)
Neutrophils: 52 %
Platelets: 257 10*3/uL (ref 150–379)
RBC: 4.09 x10E6/uL (ref 3.77–5.28)
RDW: 13.9 % (ref 12.3–15.4)
WBC: 5.7 10*3/uL (ref 3.4–10.8)

## 2017-11-01 LAB — HEPATIC FUNCTION PANEL
ALBUMIN: 4 g/dL (ref 3.5–5.5)
ALK PHOS: 71 IU/L (ref 39–117)
ALT: 19 IU/L (ref 0–32)
AST: 17 IU/L (ref 0–40)
Bilirubin Total: 0.3 mg/dL (ref 0.0–1.2)
Bilirubin, Direct: 0.12 mg/dL (ref 0.00–0.40)
Total Protein: 7.2 g/dL (ref 6.0–8.5)

## 2017-11-01 NOTE — Telephone Encounter (Signed)
LMOM to let pt. know that lab work done in our office yesterday was all fine.  She does not need to return this call unless she has questions/fim

## 2017-11-01 NOTE — Telephone Encounter (Signed)
-----   Message from Asa Lente, MD sent at 11/01/2017  9:16 AM EDT ----- Please let the patient know that the lab work is fine.

## 2017-11-14 ENCOUNTER — Telehealth: Payer: Self-pay | Admitting: Neurology

## 2017-11-14 NOTE — Telephone Encounter (Signed)
Patient is returning a call. °

## 2017-11-14 NOTE — Telephone Encounter (Signed)
LMTC./fim 

## 2017-11-14 NOTE — Telephone Encounter (Signed)
Spoke with Haley Mitchell.  She has had ongoing blurry vision right eye only for several weeks.  This was addressed at her last ov.  At that time, RAS wanted pt. to monitor sx. She sts. since yesterday, blurry vision is the same, now having some light sensitivity, and doesn't feel vision adjusts as quickly when she comes in from outdoors.  Will speak with RAS and call her back./fim

## 2017-11-14 NOTE — Telephone Encounter (Signed)
Pt called she states the rt eye is blurry, light sensitive. She aid this was discussed at her last OV.  Please call to advise.

## 2017-11-15 NOTE — Telephone Encounter (Signed)
LMTC.  Per RAS, ok for 3 days of IV SM 1 gm daily.  Orders given to [facility]. They can do pt's first day infusion now if she can come in/fim

## 2018-01-05 ENCOUNTER — Telehealth: Payer: Self-pay | Admitting: Neurology

## 2018-01-05 MED ORDER — GABAPENTIN 600 MG PO TABS: ORAL_TABLET | ORAL | 1 refills | Status: DC

## 2018-01-05 NOTE — Telephone Encounter (Signed)
[facility]/fim 

## 2018-01-05 NOTE — Telephone Encounter (Signed)
Pt request refill for gabapentin (NEURONTIN) 600 MG tablet sent to [facility]

## 2018-01-30 ENCOUNTER — Telehealth: Payer: Self-pay | Admitting: Neurology

## 2018-01-30 NOTE — Telephone Encounter (Signed)
Spoke with Phaedra. She c/o increased right sided lbp radiating into right buttock.  No injury. "I'm miserable."  Appt. given 02/02/18 at 1130, arrival time 11am/fim

## 2018-01-30 NOTE — Telephone Encounter (Signed)
Pt requesting a call from RN, stating she has been having lower back pain. Pt unsure if this part of her MS pains or if this is in regards to her sciatic nerve. Please call to advise

## 2018-02-02 ENCOUNTER — Ambulatory Visit: Payer: Medicaid Other | Admitting: Neurology

## 2018-02-02 ENCOUNTER — Other Ambulatory Visit: Payer: Self-pay

## 2018-02-02 ENCOUNTER — Encounter: Payer: Self-pay | Admitting: Neurology

## 2018-02-02 VITALS — BP 143/96 | HR 76 | Resp 18 | Ht 60.0 in | Wt 244.5 lb

## 2018-02-02 DIAGNOSIS — R200 Anesthesia of skin: Secondary | ICD-10-CM | POA: Diagnosis not present

## 2018-02-02 DIAGNOSIS — G35 Multiple sclerosis: Secondary | ICD-10-CM | POA: Diagnosis not present

## 2018-02-02 DIAGNOSIS — R5383 Other fatigue: Secondary | ICD-10-CM | POA: Diagnosis not present

## 2018-02-02 MED ORDER — METHYLPREDNISOLONE 4 MG PO TBPK: ORAL_TABLET | ORAL | 0 refills | Status: DC

## 2018-02-02 NOTE — Progress Notes (Signed)
[facility]  PATIENT: Haley Mitchell DOB: 1981-05-10  REFERRING DOCTOR OR PCP:  Catalina Pizza, FNP SOURCE: Patient, notes from Ms. Myrtie Soman and ED,  MRI report and MRI images on PACS.  _________________________________   HISTORICAL  CHIEF COMPLAINT:  Chief Complaint  Patient presents with  . Multiple Sclerosis    Sts. she continues to tolerate Rebif 44mg s well.  Here today with new c/o of right sided lbp radiating into right buttock and down back of right leg to mid thigh, onset one wk. ago without known injury. Nothing helps.  Prolonged sitting/standing make it worse/fim  . Back Pain    HISTORY OF PRESENT ILLNESS:  Haley Mitchell is a 37 year old woman who was diagnosed with MS in December 2018  Update 02/02/2018: She started Rebif in February 2019 and had a small exacerbation 6-7 weeks later with right optic neuritis.Marland Kitchen  Her initial neurologic event was in 2016 with numbness and weakness in her left leg x 2 weeks and then in 04/2017 she had facial numbness leading to an MRI c/w MS.     She feels her vision is practically to baseline though we note mild color vision asymmetry, less vivid OS.  She feels gait, strength and sensation are all normal now.    Gabapentin has helped her mild dysesthesia.    Bladder is fine.  She had fatigue and armodafinil was started with benefit.    Mood is fine and cognition is doing well.  She has right lower back pain going into the buttocks and down the back to mid thigh.      Update 10/31/2017: She is tolerating Rebif well.  She has been on x 2 months now (started early February 2000)  She has some flulike symptoms after her shots but these are generally mild.  She has recovered well from her initial exacerbation.  She no longer has tingling in the face or legs.   Her gait is fine and she can climb stairs.   She still stumbles some but no falls.     Bladder function is fine.   The right eye is blurry x 2 weeks.     She is  experiencing more fatigue, physical and cognitive.    She falls asleep well but sometimes wakes up and has trouble getting back asleep.   However, even after a good nights sleep she is tired.   She does not have sleepiness.    She denies depression.   She notes that she is mildly forgetful.      Update to 08/02/2017: Since the last visit, she has had MRIs of the spine. It shows a definite plaque to the left at T1 and a more subtle focus to the right at C3-C4, both consistent with demyelinating lesions as would be seen with multiple sclerosis.  The focus at T1 to the left can explain the symptoms that occurred in her left leg 2 years ago. In retrospect she also recalls one year ago having some heaviness in the right arm for about a week. In October 2018 she had a definite exacerbation with numbness and Combined with her symptoms and her abnormal brain MRI, we can be fairly certain of the diagnosis of multiple sclerosis and she meets McDonald criteria.   She is here today to discuss the possible DMTs.  We had a long discussion about treatment options for her newly diagnosed MS going over efficacy, safety and tolerability.. Her presentation is of medium aggressiveness and she would be a  candidate for many different medications. Due to the long safety records of the interferons, she would prefer to start with Rebif. She will consider one of the oral agents or one of the IV medications if she has breakthrough activity.  From 06/21/2017: Around 05/16/2017 she woke up with numbness and side of the face, tongue and lip. She presented to the emergency room 05/19/2017. There was no weakness but words were slightly slurred.    No numbness, weakness or clumsiness elsewhere.    An MRI of the brain, that I personally reviewed, was performed showing about a half dozen T2/FLAIR hyperintense foci, 3 of which were periventricular, radially oriented to the ventricles.   None of the foci enhanced after contrast.   She was  prescribed prednisone pills. Over the next week the numbness resolved. She does not have any facial numbness now.  In retrospect, about 2 years ago, while she was living in [place], she had the onset of numbness and stiffness with spasms in the left leg. At that time, there were no symptoms in the right leg. Over a couple weeks the symptoms improved but she continues to get some intermittent numbness. More recently she has noted numbness and restlessness in her legs when she lays down at night. If she moves around the symptoms improve.  In 2017, she had the vertical sleeve bariatric operation. She lost 65 pounds quickly. Of note, the episode of numbness preceded her operation.   She notes no problems with her vision or her bladder.     She notes fatigue but this has been a problem x years.   She sleeps poorly some nights, sometimes because her legs are numb.   She denies any mood issues or problems with cognition.  She denies any family history of multiple sclerosis. Her mother has psoriasis.   REVIEW OF SYSTEMS: Constitutional: No fevers, chills, sweats, or change in appetite.  She has insomnia and probable restless leg syndrome. Eyes: No visual changes, double vision, eye pain Ear, nose and throat: No hearing loss, ear pain, nasal congestion, sore throat Cardiovascular: No chest pain, palpitations Respiratory: No shortness of breath at rest or with exertion.   No wheezes GastrointestinaI: No nausea, vomiting, diarrhea, abdominal pain, fecal incontinence Genitourinary: No dysuria, urinary retention or frequency.  No nocturia. Musculoskeletal: No neck pain, back pain Integumentary: No rash, pruritus, skin lesions Neurological: as above Psychiatric: No depression at this time.  No anxiety Endocrine: No palpitations, diaphoresis, change in appetite, change in weigh or increased thirst Hematologic/Lymphatic: No anemia, purpura, petechiae. Allergic/Immunologic: No itchy/runny eyes,  nasal congestion, recent allergic reactions, rashes  ALLERGIES: No Known Allergies  HOME MEDICATIONS:  Current Outpatient Medications:  .  Armodafinil 200 MG TABS, One po qAM, Disp: 30 tablet, Rfl: 5 .  gabapentin (NEURONTIN) 600 MG tablet, TAKE HALF TO 1 TABLET BY MOUTH AT BEDTIME, Disp: 90 tablet, Rfl: 1 .  ibuprofen (ADVIL,MOTRIN) 800 MG tablet, Take 800 mg by mouth every 8 (eight) hours as needed for mild pain., Disp: , Rfl:  .  Interferon Beta-1a (REBIF REBIDOSE) 44 MCG/0.5ML SOAJ, Inject 44 mcg into the skin 3 (three) times a week., Disp: , Rfl:  .  naproxen (NAPROSYN) 500 MG tablet, Take 500 mg by mouth 2 (two) times daily., Disp: , Rfl: 0 .  methylPREDNISolone (MEDROL DOSEPAK) 4 MG TBPK tablet, 6 pills po x 1 day, then 5 pills po x 1 day, then 4 pills po x 1 day, then 3 pills po x 1 day,  then 2 pills po x 1 d, then 1 pill po x 1 d, Disp: 21 tablet, Rfl: 0  PAST MEDICAL HISTORY: Past Medical History:  Diagnosis Date  . Headache   . Plantar fasciitis   . Vision abnormalities     PAST SURGICAL HISTORY: Past Surgical History:  Procedure Laterality Date  . c section    . GASTRIC BYPASS      FAMILY HISTORY: Family History  Problem Relation Age of Onset  . Hypertension Mother   . Heart disease Mother     SOCIAL HISTORY:  Social History   Socioeconomic History  . Marital status: Married    Spouse name: Not on file  . Number of children: Not on file  . Years of education: Not on file  . Highest education level: Not on file  Occupational History  . Not on file  Social Needs  . Financial resource strain: Not on file  . Food insecurity:    Worry: Not on file    Inability: Not on file  . Transportation needs:    Medical: Not on file    Non-medical: Not on file  Tobacco Use  . Smoking status: Never Smoker  . Smokeless tobacco: Never Used  Substance and Sexual Activity  . Alcohol use: Yes    Comment: social  . Drug use: No  . Sexual activity: Not on file    Lifestyle  . Physical activity:    Days per week: Not on file    Minutes per session: Not on file  . Stress: Not on file  Relationships  . Social connections:    Talks on phone: Not on file    Gets together: Not on file    Attends religious service: Not on file    Active member of club or organization: Not on file    Attends meetings of clubs or organizations: Not on file    Relationship status: Not on file  . Intimate partner violence:    Fear of current or ex partner: Not on file    Emotionally abused: Not on file    Physically abused: Not on file    Forced sexual activity: Not on file  Other Topics Concern  . Not on file  Social History Narrative  . Not on file     PHYSICAL EXAM  Vitals:   02/02/18 1123  BP: (!) 143/96  Pulse: 76  Resp: 18  Weight: 244 lb 8 oz (110.9 kg)  Height: 5' (1.524 m)    Body mass index is 47.75 kg/m.   General: The patient is well-developed and well-nourished and in no acute distress  Eyes:  Funduscopic exam shows normal optic discs and retinal vessels.   Neurologic Exam  Mental status: The patient is alert and oriented x 3 at the time of the examination. The patient has apparent normal recent and remote memory, with an apparently normal attention span and concentration ability.   Speech is normal.  Cranial nerves: Extraocular movements are full.  She has a right APD.  Mildly reduced color vision OD.  Facial strength and sensation is normal.  The tongue is midline, and the patient has symmetric elevation of the soft palate. No obvious hearing deficits are noted.  Motor:  Muscle bulk is normal.   Tone is normal. Strength is  5 / 5 in all 4 extremities.   Sensory: In the arms, she had symmetric sensation to touch, temperature and vibration. The legs, she had intact and symmetric sensation  to touch and temperature and vibration.  Coordination: Cerebellar testing shows good finger-nose-finger and heel-to-shin  Gait and station:  Station is normal.   Gait and tandem gait are normal.  Romberg is negative.   Reflexes: Deep tendon reflexes are symmetric and normal bilaterally.       DIAGNOSTIC DATA (LABS, IMAGING, TESTING) - I reviewed patient records, labs, notes, testing and imaging myself where available.  Lab Results  Component Value Date   WBC 5.7 10/31/2017   HGB 12.2 10/31/2017   HCT 37.8 10/31/2017   MCV 92 10/31/2017   PLT 257 10/31/2017      Component Value Date/Time   NA 140 05/19/2017 0950   K 3.9 05/19/2017 0950   CL 110 05/19/2017 0950   CO2 24 05/19/2017 0950   GLUCOSE 83 05/19/2017 0950   BUN 17 05/19/2017 0950   CREATININE 0.89 05/19/2017 0950   CALCIUM 9.2 05/19/2017 0950   PROT 7.2 10/31/2017 1239   ALBUMIN 4.0 10/31/2017 1239   AST 17 10/31/2017 1239   ALT 19 10/31/2017 1239   ALKPHOS 71 10/31/2017 1239   BILITOT 0.3 10/31/2017 1239   GFRNONAA >60 05/19/2017 0950   GFRAA >60 05/19/2017 0950       ASSESSMENT AND PLAN  Multiple sclerosis (HCC)  Facial numbness  Other fatigue   1.      Continue Rebif.  We discussed that if she has another exacerbation or if an MRI later in the year or early next year shows significant subclinical progression we will need to consider a stronger disease modifying therapy.. 2.     Continue Nuvigil for fatigue. 3.   Return in 6 months or sooner if there are new or worsening neurologic symptoms.     Ayrton Mcvay A. Epimenio Foot, MD, [facility] 02/02/2018, 1:26 PM Certified in Neurology, Clinical Neurophysiology, Sleep Medicine, Pain Medicine and Neuroimaging  [facility] [address] [place], [place] [address] [phone]

## 2018-03-06 ENCOUNTER — Ambulatory Visit: Payer: Medicaid Other | Admitting: Neurology

## 2018-04-26 ENCOUNTER — Telehealth: Payer: Self-pay | Admitting: Neurology

## 2018-04-26 NOTE — Telephone Encounter (Signed)
Spoke with Haley Mitchell and explained she [facility] to [facility]

## 2018-04-26 NOTE — Telephone Encounter (Signed)
Pt has just found out she was switched from full medicaid to family medicaid. She has found gabapentin cheaper at [facility] using [employer] card. She needs script sent there. She is out of this medication.

## 2018-05-19 ENCOUNTER — Telehealth: Payer: Self-pay | Admitting: *Deleted

## 2018-05-19 NOTE — Telephone Encounter (Signed)
Received fax notification from [employer] that pt approved for pt assistance 05-19-18 to 07-25-18.

## 2018-07-20 ENCOUNTER — Telehealth: Payer: Self-pay | Admitting: *Deleted

## 2018-07-20 NOTE — Telephone Encounter (Signed)
Received fax notification from [employer] ath pt approved for PAP from 07/26/18-07/26/19.

## 2018-08-07 ENCOUNTER — Ambulatory Visit: Payer: Medicaid Other | Admitting: Neurology

## 2018-08-07 ENCOUNTER — Other Ambulatory Visit: Payer: Self-pay | Admitting: *Deleted

## 2018-08-07 MED ORDER — GABAPENTIN 600 MG PO TABS: ORAL_TABLET | ORAL | 0 refills | Status: DC

## 2018-08-28 IMAGING — MG 2D DIGITAL DIAGNOSTIC BILATERAL MAMMOGRAM WITH CAD AND ADJUNCT T
8 of 15 series · 8 of 35 positions shown · non-contrast
Comparison: Previous exam(s).

CLINICAL DATA: 35-year-old female with a left breast palpable
abnormality.

EXAM:
2D DIGITAL DIAGNOSTIC BILATERAL MAMMOGRAM WITH CAD AND ADJUNCT TOMO
LEFT BREAST ULTRASOUND

[L XCCL synth-2D]
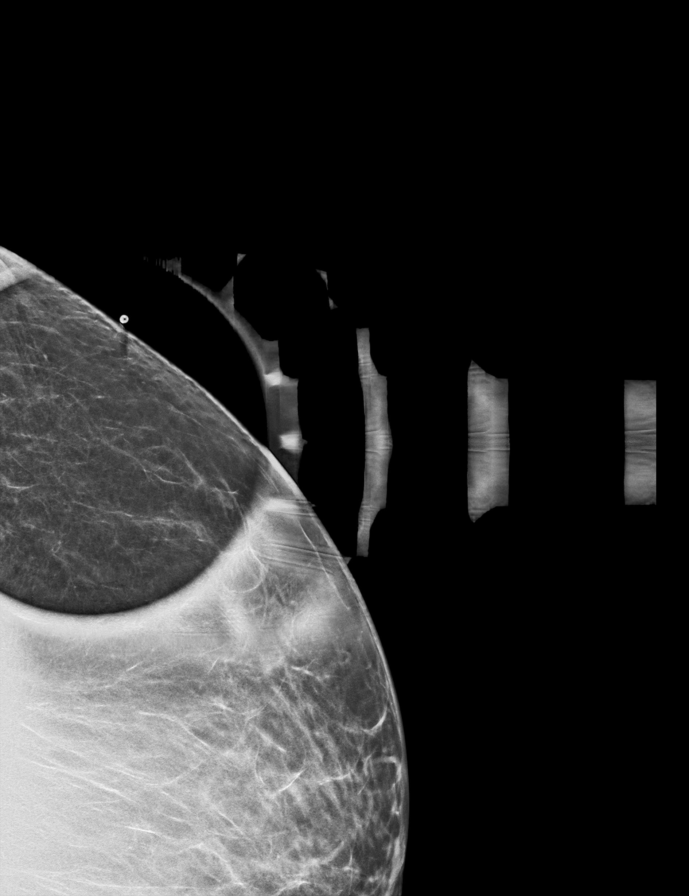

[R MLO synth-2D]
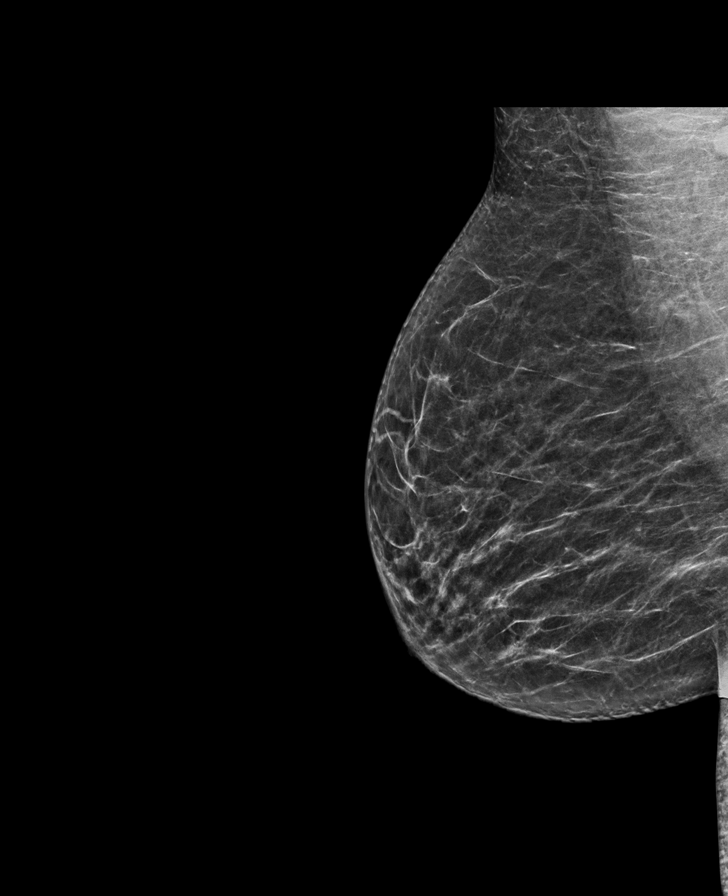

[R CC synth-2D]
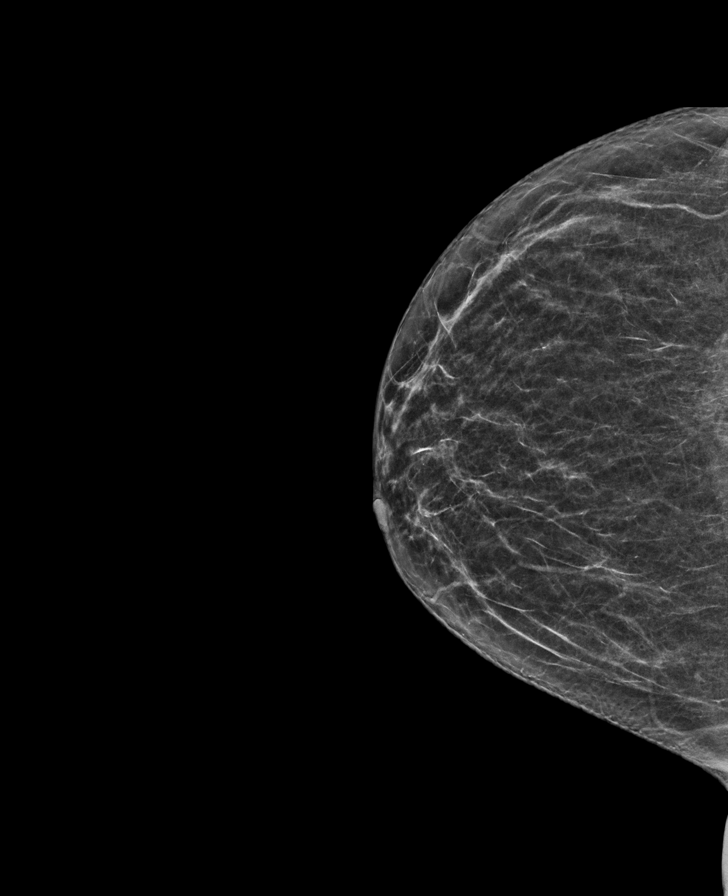

[L MLO]
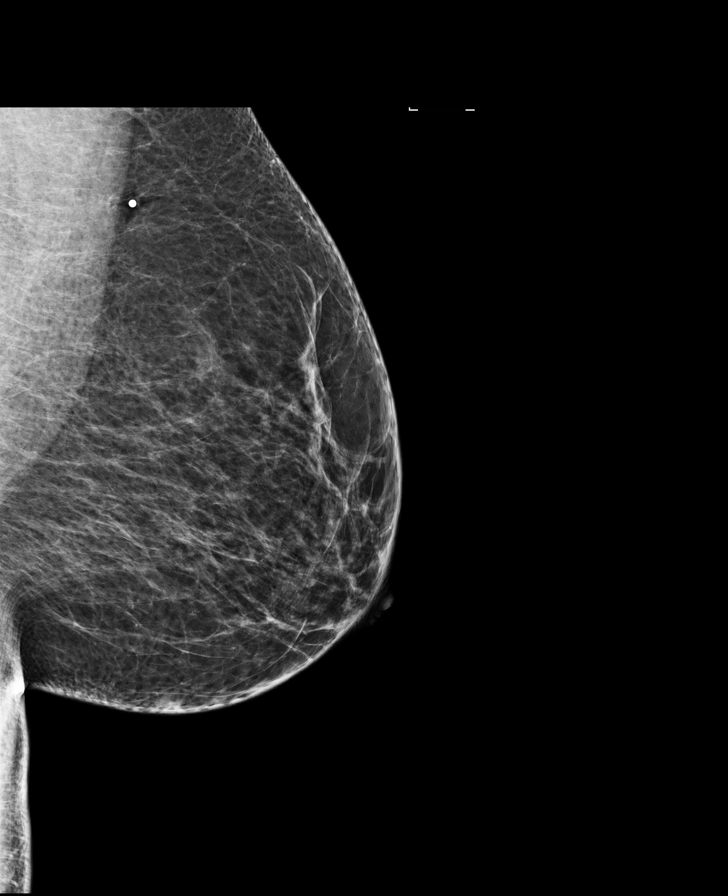

[L MLO synth-2D]
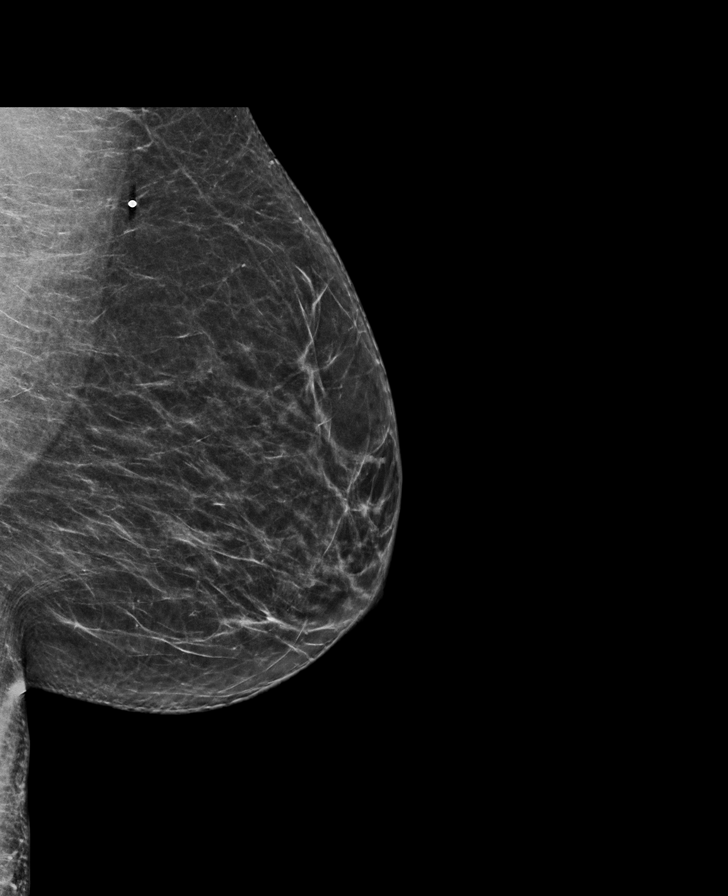

[R CC]
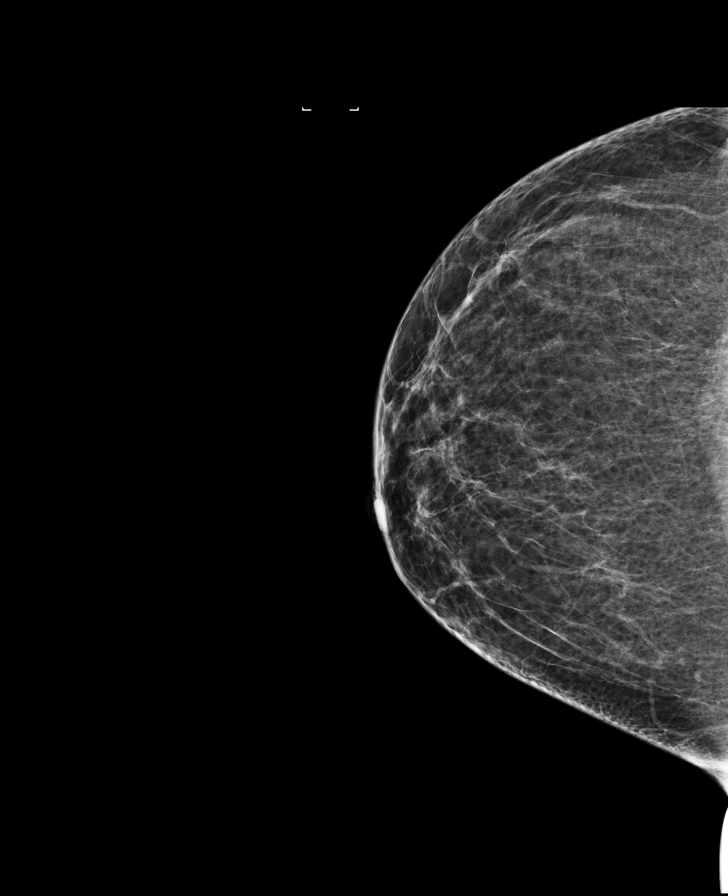

[L XCCL]
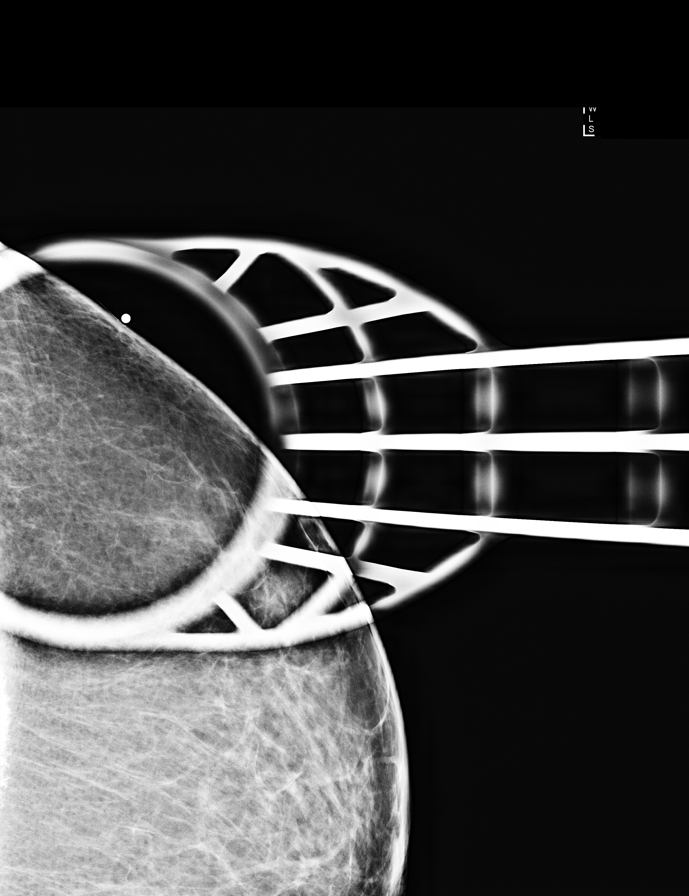

[R MLO]
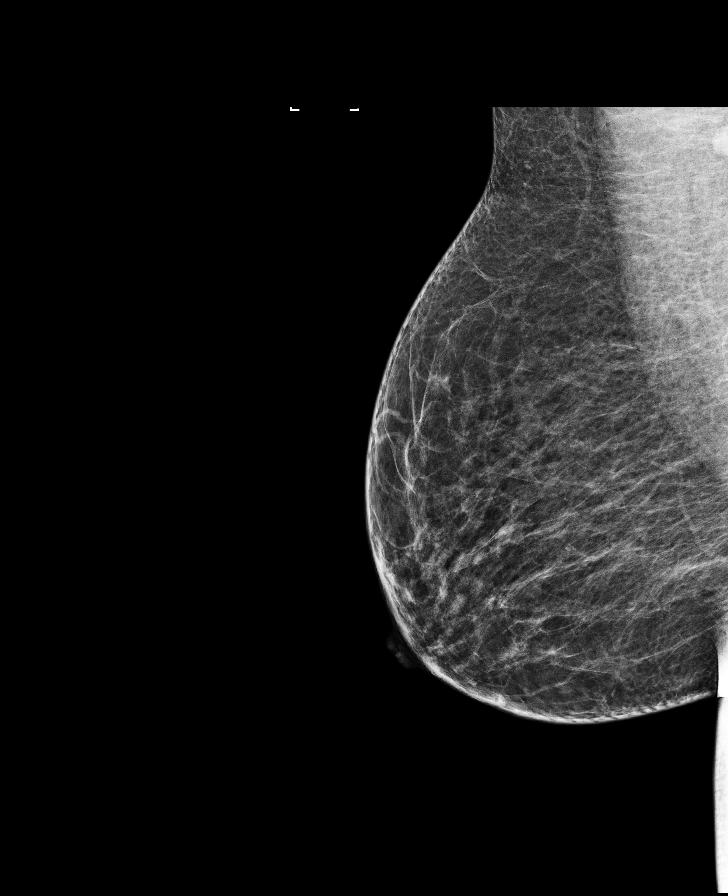

[8 of 35 positions shown; findings below may reference images not displayed]

ACR Breast Density Category b: There are scattered areas of
fibroglandular density.
FINDINGS: No suspicious masses or calcifications are seen in either breast.
Spot compression XCCL tomograms were performed over the far outer
left breast at the site of palpable concern with no mammographic
abnormality identified.

Mammographic images were processed with CAD.

Physical examination of the upper-outer left breast in the region of
the axillary tail reveals generalized thickening, however no
underlying palpable mass.

Targeted ultrasound of the upper-outer left breast was performed. No
suspicious masses or abnormalities identified, only heterogeneous
fibroglandular tissue is seen.
IMPRESSION: 1.  No findings of malignancy in either breast.

2. No mammographic or sonographic correlate for the left breast
palpable abnormality.

RECOMMENDATION:
1. Recommend further management of the left breast palpable
abnormality be based on clinical assessment.

2. Screening mammogram at age 40 unless there are persistent or
intervening clinical concerns. (Code:A1-R-[id number])

I have discussed the findings and recommendations with the patient.
Results were also provided in writing at the conclusion of the
visit. If applicable, a reminder letter will be sent to the patient
regarding the next appointment.

BI-RADS CATEGORY  1: Negative.

## 2018-09-14 ENCOUNTER — Other Ambulatory Visit (HOSPITAL_COMMUNITY): Payer: Self-pay | Admitting: Family

## 2018-09-22 ENCOUNTER — Other Ambulatory Visit: Payer: Self-pay

## 2018-09-22 ENCOUNTER — Encounter: Payer: Self-pay | Admitting: Neurology

## 2018-09-22 ENCOUNTER — Ambulatory Visit: Payer: Commercial Managed Care - PPO | Admitting: Neurology

## 2018-09-22 VITALS — BP 139/100 | HR 79 | Ht 60.0 in | Wt 242.0 lb

## 2018-09-22 DIAGNOSIS — R5383 Other fatigue: Secondary | ICD-10-CM

## 2018-09-22 DIAGNOSIS — Z8669 Personal history of other diseases of the nervous system and sense organs: Secondary | ICD-10-CM | POA: Insufficient documentation

## 2018-09-22 DIAGNOSIS — G35 Multiple sclerosis: Secondary | ICD-10-CM | POA: Diagnosis not present

## 2018-09-22 DIAGNOSIS — R200 Anesthesia of skin: Secondary | ICD-10-CM

## 2018-09-22 DIAGNOSIS — Z79899 Other long term (current) drug therapy: Secondary | ICD-10-CM

## 2018-09-22 MED ORDER — GABAPENTIN 600 MG PO TABS: ORAL_TABLET | ORAL | 5 refills | Status: DC

## 2018-09-22 MED ORDER — ARMODAFINIL 200 MG PO TABS: ORAL_TABLET | ORAL | 5 refills | Status: DC

## 2018-09-22 NOTE — Progress Notes (Signed)
[facility]  PATIENT: Haley Mitchell DOB: 27-May-1981  REFERRING DOCTOR OR PCP:  Catalina Pizza, FNP SOURCE: Patient, notes from Ms. Myrtie Soman and ED,  MRI report and MRI images on PACS.  _________________________________   HISTORICAL  CHIEF COMPLAINT:  Chief Complaint  Patient presents with  . Follow-up    RM 12 w/. Last seen 02/02/18. She just started on hydrochlorothiazide this morning for high BP.  . Multiple Sclerosis    On Rebif, tolerating well. No new sx. Still very fatigued.     HISTORY OF PRESENT ILLNESS:  Haley Mitchell is a 38 year old woman who was diagnosed with MS in December 2018  Update 09/22/2018: She is on Rebif and she tolerates it well.    No more relapses since 08/2017, a few weeks after she started Rebif.     Her vision has returned to normal and colors are not desaturated.     She is walking well and can do stairs.   Strength and sensation are doing ok.   She has some dysesthesias in hr feet if she stands a long time and painful tingling some nights.   Bladder function is fine.  She has fatigue daily.   She is getting 6 hours sleep on average.  She has a lot of variability in bedtime, going to bed earlier if tired.   She usually falls asleep easily and stays asleep.    She snores but no definite OSA sign.     She takes armodafinil with benefit.       EPWORTH SLEEPINESS SCALE  On a scale of 0 - 3 what is the chance of dozing:  Sitting and Reading:   0 Watching TV:    1 Sitting inactive in a public place: 0 Passenger in car for one hour: 3 Lying down to rest in the afternoon: 3 Sitting and talking to someone: 0 Sitting quietly after lunch:  0 In a car, stopped in traffic:  0  Total (out of 24):   7/24   Normal.   Update 02/02/2018: She started Rebif in February 2019 and had a small exacerbation 6-7 weeks later with right optic neuritis.Marland Kitchen  Her initial neurologic event was in 2016 with numbness and weakness in her left leg x 2 weeks and  then in 04/2017 she had facial numbness leading to an MRI c/w MS.     She feels her vision is practically to baseline though we note mild color vision asymmetry, less vivid OS.  She feels gait, strength and sensation are all normal now.    Gabapentin has helped her mild dysesthesia.    Bladder is fine.  She had fatigue and armodafinil was started with benefit.    Mood is fine and cognition is doing well.  She has right lower back pain going into the buttocks and down the back to mid thigh.      Update 10/31/2017: She is tolerating Rebif well.  She has been on x 2 months now (started early February 2000)  She has some flulike symptoms after her shots but these are generally mild.  She has recovered well from her initial exacerbation.  She no longer has tingling in the face or legs.   Her gait is fine and she can climb stairs.   She still stumbles some but no falls.     Bladder function is fine.   The right eye is blurry x 2 weeks.     She is experiencing more fatigue, physical and cognitive.  She falls asleep well but sometimes wakes up and has trouble getting back asleep.   However, even after a good nights sleep she is tired.   She does not have sleepiness.    She denies depression.   She notes that she is mildly forgetful.      Update to 08/02/2017: Since the last visit, she has had MRIs of the spine. It shows a definite plaque to the left at T1 and a more subtle focus to the right at C3-C4, both consistent with demyelinating lesions as would be seen with multiple sclerosis.  The focus at T1 to the left can explain the symptoms that occurred in her left leg 2 years ago. In retrospect she also recalls one year ago having some heaviness in the right arm for about a week. In October 2018 she had a definite exacerbation with numbness and Combined with her symptoms and her abnormal brain MRI, we can be fairly certain of the diagnosis of multiple sclerosis and she meets McDonald criteria.   She is here  today to discuss the possible DMTs.  We had a long discussion about treatment options for her newly diagnosed MS going over efficacy, safety and tolerability.. Her presentation is of medium aggressiveness and she would be a candidate for many different medications. Due to the long safety records of the interferons, she would prefer to start with Rebif. She will consider one of the oral agents or one of the IV medications if she has breakthrough activity.  From 06/21/2017: Around 05/16/2017 she woke up with numbness and side of the face, tongue and lip. She presented to the emergency room 05/19/2017. There was no weakness but words were slightly slurred.    No numbness, weakness or clumsiness elsewhere.    An MRI of the brain, that I personally reviewed, was performed showing about a half dozen T2/FLAIR hyperintense foci, 3 of which were periventricular, radially oriented to the ventricles.   None of the foci enhanced after contrast.   She was prescribed prednisone pills. Over the next week the numbness resolved. She does not have any facial numbness now.  In retrospect, about 2 years ago, while she was living in [place], she had the onset of numbness and stiffness with spasms in the left leg. At that time, there were no symptoms in the right leg. Over a couple weeks the symptoms improved but she continues to get some intermittent numbness. More recently she has noted numbness and restlessness in her legs when she lays down at night. If she moves around the symptoms improve.  In 2017, she had the vertical sleeve bariatric operation. She lost 65 pounds quickly. Of note, the episode of numbness preceded her operation.   She notes no problems with her vision or her bladder.     She notes fatigue but this has been a problem x years.   She sleeps poorly some nights, sometimes because her legs are numb.   She denies any mood issues or problems with cognition.  She denies any family history of multiple  sclerosis. Her mother has psoriasis.   REVIEW OF SYSTEMS: Constitutional: No fevers, chills, sweats, or change in appetite.  She has insomnia and probable restless leg syndrome. Eyes: No visual changes, double vision, eye pain Ear, nose and throat: No hearing loss, ear pain, nasal congestion, sore throat Cardiovascular: No chest pain, palpitations Respiratory: No shortness of breath at rest or with exertion.   No wheezes GastrointestinaI: No nausea, vomiting, diarrhea, abdominal pain,  fecal incontinence Genitourinary: No dysuria, urinary retention or frequency.  No nocturia. Musculoskeletal: No neck pain, back pain Integumentary: No rash, pruritus, skin lesions Neurological: as above Psychiatric: No depression at this time.  No anxiety Endocrine: No palpitations, diaphoresis, change in appetite, change in weigh or increased thirst Hematologic/Lymphatic: No anemia, purpura, petechiae. Allergic/Immunologic: No itchy/runny eyes, nasal congestion, recent allergic reactions, rashes  ALLERGIES: No Known Allergies  HOME MEDICATIONS:  Current Outpatient Medications:  .  Armodafinil 200 MG TABS, One po qAM, Disp: 30 tablet, Rfl: 5 .  gabapentin (NEURONTIN) 600 MG tablet, TAKE HALF TO 1 TABLET BY MOUTH AT BEDTIME, Disp: 90 tablet, Rfl: 0 .  hydrochlorothiazide (MICROZIDE) 12.5 MG capsule, Take 12.5 mg by mouth daily., Disp: , Rfl:  .  ibuprofen (ADVIL,MOTRIN) 800 MG tablet, Take 800 mg by mouth every 8 (eight) hours as needed for mild pain., Disp: , Rfl:  .  Interferon Beta-1a (REBIF REBIDOSE) 44 MCG/0.5ML SOAJ, Inject 44 mcg into the skin 3 (three) times a week., Disp: , Rfl:  .  naproxen (NAPROSYN) 500 MG tablet, Take 500 mg by mouth 2 (two) times daily., Disp: , Rfl: 0  PAST MEDICAL HISTORY: Past Medical History:  Diagnosis Date  . Headache   . Plantar fasciitis   . Vision abnormalities     PAST SURGICAL HISTORY: Past Surgical History:  Procedure Laterality Date  . c section     . GASTRIC BYPASS      FAMILY HISTORY: Family History  Problem Relation Age of Onset  . Hypertension Mother   . Heart disease Mother     SOCIAL HISTORY:  Social History   Socioeconomic History  . Marital status: Married    Spouse name: Not on file  . Number of children: Not on file  . Years of education: Not on file  . Highest education level: Not on file  Occupational History  . Not on file  Social Needs  . Financial resource strain: Not on file  . Food insecurity:    Worry: Not on file    Inability: Not on file  . Transportation needs:    Medical: Not on file    Non-medical: Not on file  Tobacco Use  . Smoking status: Never Smoker  . Smokeless tobacco: Never Used  Substance and Sexual Activity  . Alcohol use: Yes    Comment: social  . Drug use: No  . Sexual activity: Not on file  Lifestyle  . Physical activity:    Days per week: Not on file    Minutes per session: Not on file  . Stress: Not on file  Relationships  . Social connections:    Talks on phone: Not on file    Gets together: Not on file    Attends religious service: Not on file    Active member of club or organization: Not on file    Attends meetings of clubs or organizations: Not on file    Relationship status: Not on file  . Intimate partner violence:    Fear of current or ex partner: Not on file    Emotionally abused: Not on file    Physically abused: Not on file    Forced sexual activity: Not on file  Other Topics Concern  . Not on file  Social History Narrative  . Not on file     PHYSICAL EXAM  Vitals:   09/22/18 0949  BP: (!) 139/100  Pulse: 79  Weight: 242 lb (109.8 kg)  Height: 5' (  1.524 m)    Body mass index is 47.26 kg/m.   General: The patient is well-developed and well-nourished and in no acute distress.  Pharynx is Mallampatti 1  Eyes:  Funduscopic exam shows normal optic discs and retinal vessels.   Neurologic Exam  Mental status: The patient is alert and  oriented x 3 at the time of the examination. The patient has apparent normal recent and remote memory, with an apparently normal attention span and concentration ability.   Speech is normal.  Cranial nerves: Extraocular movements are full.  She has a right APD.  Color vision was normal and symmetric.  Facial strength and sensation was normal.  Palatal elevation and tongue protrusion was midline.  No obvious hearing deficits are noted.  Motor:  Muscle bulk is normal.   Tone is normal. Strength is  5 / 5 in all 4 extremities.   Sensory: In the arms, she had symmetric sensation to touch, temperature and vibration. The legs, she had intact and symmetric sensation to touch and temperature and vibration.  Coordination: Finger-nose-finger and heel-to-shin was performed well.  Gait and station: Station is normal.   Gait and tandem gait are normal.  Romberg is negative.   Reflexes: Deep tendon reflexes are symmetric and normal bilaterally.       DIAGNOSTIC DATA (LABS, IMAGING, TESTING) - I reviewed patient records, labs, notes, testing and imaging myself where available.  Lab Results  Component Value Date   WBC 5.7 10/31/2017   HGB 12.2 10/31/2017   HCT 37.8 10/31/2017   MCV 92 10/31/2017   PLT 257 10/31/2017      Component Value Date/Time   NA 140 05/19/2017 0950   K 3.9 05/19/2017 0950   CL 110 05/19/2017 0950   CO2 24 05/19/2017 0950   GLUCOSE 83 05/19/2017 0950   BUN 17 05/19/2017 0950   CREATININE 0.89 05/19/2017 0950   CALCIUM 9.2 05/19/2017 0950   PROT 7.2 10/31/2017 1239   ALBUMIN 4.0 10/31/2017 1239   AST 17 10/31/2017 1239   ALT 19 10/31/2017 1239   ALKPHOS 71 10/31/2017 1239   BILITOT 0.3 10/31/2017 1239   GFRNONAA >60 05/19/2017 0950   GFRAA >60 05/19/2017 0950       ASSESSMENT AND PLAN  Other fatigue  Multiple sclerosis (HCC)  Left leg numbness  History of optic neuritis   1.      Continue Rebif.  Check MRi to rule out subclinical progression.  If  present we will need to consider a different disease modifying therapy..  We will also check CBC and CMP. 2.     Continue Nuvigil for fatigue and gabapentin for dysesthesias 3.   Return in 6 months or sooner if new or worsening neurologic symptoms.  Ervey Fallin A. Epimenio Foot, MD, [facility] 09/22/2018, 10:29 AM Certified in Neurology, Clinical Neurophysiology, Sleep Medicine, Pain Medicine and Neuroimaging  [facility] [address] [place], [place] [address] [phone]

## 2018-09-23 LAB — COMPREHENSIVE METABOLIC PANEL
A/G RATIO: 1.3 (ref 1.2–2.2)
ALT: 12 IU/L (ref 0–32)
AST: 10 IU/L (ref 0–40)
Albumin: 4.1 g/dL (ref 3.8–4.8)
Alkaline Phosphatase: 75 IU/L (ref 39–117)
BUN/Creatinine Ratio: 11 (ref 9–23)
BUN: 10 mg/dL (ref 6–20)
Bilirubin Total: 0.3 mg/dL (ref 0.0–1.2)
CALCIUM: 9.2 mg/dL (ref 8.7–10.2)
CO2: 21 mmol/L (ref 20–29)
Chloride: 105 mmol/L (ref 96–106)
Creatinine, Ser: 0.91 mg/dL (ref 0.57–1.00)
GFR calc Af Amer: 93 mL/min/{1.73_m2} (ref >59)
GFR calc non Af Amer: 81 mL/min/{1.73_m2} (ref >59)
Globulin, Total: 3.2 g/dL (ref 1.5–4.5)
Glucose: 81 mg/dL (ref 65–99)
POTASSIUM: 4.3 mmol/L (ref 3.5–5.2)
Sodium: 139 mmol/L (ref 134–144)
Total Protein: 7.3 g/dL (ref 6.0–8.5)

## 2018-09-23 LAB — CBC WITH DIFFERENTIAL/PLATELET
BASOS: 1 %
Basophils Absolute: 0 10*3/uL (ref 0.0–0.2)
EOS (ABSOLUTE): 0 10*3/uL (ref 0.0–0.4)
Eos: 1 %
Hematocrit: 36.8 % (ref 34.0–46.6)
Hemoglobin: 12.1 g/dL (ref 11.1–15.9)
Immature Grans (Abs): 0 10*3/uL (ref 0.0–0.1)
Immature Granulocytes: 0 %
Lymphocytes Absolute: 1.6 10*3/uL (ref 0.7–3.1)
Lymphs: 46 %
MCH: 29.4 pg (ref 26.6–33.0)
MCHC: 32.9 g/dL (ref 31.5–35.7)
MCV: 89 fL (ref 79–97)
Monocytes Absolute: 0.3 10*3/uL (ref 0.1–0.9)
Monocytes: 10 %
Neutrophils Absolute: 1.5 10*3/uL (ref 1.4–7.0)
Neutrophils: 42 %
Platelets: 267 10*3/uL (ref 150–450)
RBC: 4.12 x10E6/uL (ref 3.77–5.28)
RDW: 12.5 % (ref 11.7–15.4)
WBC: 3.4 10*3/uL (ref 3.4–10.8)

## 2018-09-25 ENCOUNTER — Telehealth: Payer: Self-pay | Admitting: *Deleted

## 2018-09-25 ENCOUNTER — Telehealth: Payer: Self-pay | Admitting: Neurology

## 2018-09-25 NOTE — Telephone Encounter (Signed)
[facility] Auth: [id number] (exp. 09/25/18 to 10/26/18) order sent to GI. They will reach out to the pt to schedule.

## 2018-09-25 NOTE — Telephone Encounter (Signed)
-----   Message from Asa Lente, MD sent at 09/24/2018  4:53 PM EST ----- Please let the patient know that the lab work is fine.

## 2018-10-06 ENCOUNTER — Ambulatory Visit
Admission: RE | Admit: 2018-10-06 | Discharge: 2018-10-06 | Disposition: A | Discharge status: Home / Self Care | Payer: Commercial Managed Care - PPO | Source: Ambulatory Visit | Attending: Neurology | Admitting: Neurology

## 2018-10-06 ENCOUNTER — Other Ambulatory Visit: Payer: Self-pay

## 2018-10-06 DIAGNOSIS — G35 Multiple sclerosis: Secondary | ICD-10-CM | POA: Diagnosis not present

## 2018-10-06 MED ORDER — GADOBENATE DIMEGLUMINE 529 MG/ML IV SOLN
20.0000 mL | Freq: Once | INTRAVENOUS | Status: AC | PRN
  Administered 2018-10-06: 20 mL via INTRAVENOUS

## 2018-10-09 ENCOUNTER — Telehealth: Payer: Self-pay | Admitting: *Deleted

## 2018-10-09 NOTE — Telephone Encounter (Signed)
-----   Message from Asa Lente, MD sent at 10/07/2018  7:37 PM EDT ----- Please let the patient know that the MRI showed one lesion in the brain not present in 2018.   We can continue the Rebif and check another MRi next year

## 2018-11-21 ENCOUNTER — Encounter: Payer: Self-pay | Admitting: Neurology

## 2018-12-27 ENCOUNTER — Encounter: Payer: Self-pay | Admitting: *Deleted

## 2019-03-23 ENCOUNTER — Ambulatory Visit: Payer: Commercial Managed Care - PPO | Admitting: Neurology

## 2019-03-29 ENCOUNTER — Ambulatory Visit: Payer: Commercial Managed Care - PPO | Admitting: Family Medicine

## 2019-04-09 ENCOUNTER — Other Ambulatory Visit: Payer: Self-pay

## 2019-04-09 ENCOUNTER — Ambulatory Visit (HOSPITAL_COMMUNITY)
Admission: EM | Admit: 2019-04-09 | Discharge: 2019-04-09 | Disposition: A | Discharge status: Home / Self Care | Payer: Commercial Managed Care - PPO | Attending: Family Medicine | Admitting: Family Medicine

## 2019-04-09 ENCOUNTER — Encounter (HOSPITAL_COMMUNITY): Payer: Self-pay | Admitting: Emergency Medicine

## 2019-04-09 DIAGNOSIS — T8029XA Infection following other infusion, transfusion and therapeutic injection, initial encounter: Secondary | ICD-10-CM | POA: Diagnosis not present

## 2019-04-09 DIAGNOSIS — Z8739 Personal history of other diseases of the musculoskeletal system and connective tissue: Secondary | ICD-10-CM | POA: Diagnosis not present

## 2019-04-09 MED ORDER — SULFAMETHOXAZOLE-TRIMETHOPRIM 800-160 MG PO TABS: 1.0000 | ORAL_TABLET | Freq: Two times a day (BID) | ORAL | 0 refills | Status: AC

## 2019-04-09 NOTE — ED Provider Notes (Signed)
[facility]    CSN: [id number] Arrival date & time: 04/09/19  1643      History   Chief Complaint Chief Complaint  Patient presents with  . Wound Check    HPI Haley Mitchell is a 38 y.o. female.   38 year old female presents with wound/skin irritation and possible infection at site of Rebif Rebidose (interferon Beta 1a) injection for her MS about 1 week ago. Usually she may have some redness and irritation at the injection site on her abdomen the same day of the injection and occasionally the next day but now the area is red, swollen, with some crusting and drainage. Also more painful to touch. Denies any fever. She has been cleaning the area with soap and water but her waistline of her pants keep rubbing against the area. She has not applied any other topical medication to the area. Other chronic health issues besides MS include headaches and currently on Neurontin and HCTZ daily and Tylenol prn.   The history is provided by the patient.    Past Medical History:  Diagnosis Date  . Headache   . Plantar fasciitis   . Vision abnormalities     Patient Active Problem List   Diagnosis Date Noted  . History of optic neuritis 09/22/2018  . Other fatigue 10/31/2017  . Multiple sclerosis ([place]) 08/02/2017  . Facial numbness 06/21/2017  . Left leg numbness 06/21/2017  . Restless leg 06/21/2017  . Abnormal brain MRI 06/21/2017    Past Surgical History:  Procedure Laterality Date  . c section    . GASTRIC BYPASS      OB History   No obstetric history on file.      Home Medications    Prior to Admission medications   Medication Sig Start Date End Date Taking? Authorizing Provider  gabapentin (NEURONTIN) 600 MG tablet TAKE HALF TO 1 TABLET BY MOUTH AT BEDTIME 09/22/18   Sater, Nanine Means, MD  hydrochlorothiazide (MICROZIDE) 12.5 MG capsule Take 12.5 mg by mouth daily.    [provider]  Interferon Beta-1a (REBIF REBIDOSE) 44 MCG/0.5ML SOAJ Inject 44 mcg  into the skin 3 (three) times a week.    [provider]  sulfamethoxazole-trimethoprim (BACTRIM DS) 800-160 MG tablet Take 1 tablet by mouth 2 (two) times daily for 10 days. 04/09/19 04/19/19  Katy Apo, NP  Armodafinil 200 MG TABS One po qAM 09/22/18 04/09/19  Sater, Nanine Means, MD    Family History Family History  Problem Relation Age of Onset  . Hypertension Mother   . Heart disease Mother     Social History Social History   Tobacco Use  . Smoking status: Never Smoker  . Smokeless tobacco: Never Used  Substance Use Topics  . Alcohol use: Yes    Comment: social  . Drug use: No     Allergies   Patient has no known allergies.   Review of Systems Review of Systems  Constitutional: Negative for activity change, appetite change, chills, fatigue and fever.  Respiratory: Negative for cough, chest tightness, shortness of breath and wheezing.   Gastrointestinal: Negative for abdominal pain, diarrhea, nausea and vomiting.  Musculoskeletal: Positive for arthralgias and myalgias.  Skin: Positive for color change and wound.  Allergic/Immunologic: Positive for immunocompromised state. Negative for environmental allergies and food allergies.  Neurological: Positive for headaches. Negative for dizziness, tremors, seizures, syncope, weakness and light-headedness.  Hematological: Negative for adenopathy. Does not bruise/bleed easily.     Physical Exam Triage  Vital Signs ED Triage Vitals [04/09/19 1728]  Enc Vitals Group     BP (!) 161/88     Pulse Rate 68     Resp 18     Temp 98 F (36.7 C)     Temp Source Oral     SpO2 100 %     Weight      Height      Head Circumference      Peak Flow      Pain Score 7     Pain Loc      Pain Edu?      Excl. in GC?    No data found.  Updated Vital Signs BP (!) 161/88 (BP Location: Right Arm)   Pulse 68   Temp 98 F (36.7 C) (Oral)   Resp 18   SpO2 100%   Visual Acuity Right Eye Distance:   Left Eye Distance:    Bilateral Distance:    Right Eye Near:   Left Eye Near:    Bilateral Near:     Physical Exam Vitals signs and nursing note reviewed.  Constitutional:      General: She is awake. She is not in acute distress.    Appearance: She is well-developed, well-groomed and overweight. She is not ill-appearing.     Comments: Patient is sitting comfortably in exam chair in no acute distress.   HENT:     Head: Normocephalic and atraumatic.     Right Ear: External ear normal.     Left Ear: External ear normal.  Eyes:     Extraocular Movements: Extraocular movements intact.     Conjunctiva/sclera: Conjunctivae normal.  Neck:     Musculoskeletal: Normal range of motion.  Cardiovascular:     Rate and Rhythm: Normal rate.  Pulmonary:     Effort: Pulmonary effort is normal.  Abdominal:     General: A surgical scar is present. There is no distension.     Palpations: Abdomen is soft.     Tenderness: There is abdominal tenderness. There is no right CVA tenderness or left CVA tenderness.       Comments: About 94mm oval area of redness, swelling and slight yellowish discharge with crusting present on right lower abdomen. Tender and warm to touch (see picture).   Musculoskeletal: Normal range of motion.  Skin:    General: Skin is warm.     Capillary Refill: Capillary refill takes less than 2 seconds.     Findings: Erythema and wound present. No bruising, ecchymosis or rash.  Neurological:     General: No focal deficit present.     Mental Status: She is alert and oriented to person, place, and time.  Psychiatric:        Mood and Affect: Mood normal.        Behavior: Behavior normal. Behavior is cooperative.        Thought Content: Thought content normal.        Judgment: Judgment normal.        UC Treatments / Results  Labs (all labs ordered are listed, but only abnormal results are displayed) Labs Reviewed - No data to display  EKG   Radiology No results found.  Procedures  Procedures (including critical care time)  Medications Ordered in UC Medications - No data to display  Initial Impression / Assessment and Plan / UC Course  I have reviewed the triage vital signs and the nursing notes.  Pertinent labs & imaging results that were available  during my care of the patient were reviewed by me and considered in my medical decision making (see chart for details).    Discussed that she appears to have a skin infection/cellulitis at the site of her medication injection. Recommend start Bactrim DS twice a day as directed. Continue to wash area with soap and water and try to keep clean and dry. Applied Bacitracin ointment on area today and covered with Tegaderm bandage. Follow-up here if redness, swelling, drainage worsen. Otherwise, follow-up with her MS provider for next injection as planned.  Final Clinical Impressions(s) / UC Diagnoses   Final diagnoses:  Infection of injection site, initial encounter     Discharge Instructions     Recommend start Bactrim Twice a day as directed. Wash area with soap and water and apply topical Bacitracin or similar antibiotic ointment and cover with gauze/bandage. Follow-up if redness, swelling or discharge worsen. Otherwise, follow-up with your MS doctor as planned.     ED Prescriptions    Medication Sig Dispense Auth. Provider   sulfamethoxazole-trimethoprim (BACTRIM DS) 800-160 MG tablet Take 1 tablet by mouth 2 (two) times daily for 10 days. 20 tablet Sudie GrumblingAmyot,  Berry, NP     Controlled Substance Prescriptions [place] Controlled Substance Registry consulted? Not Applicable   Sudie Grumblingmyot,  Berry, NP 04/10/19 1024

## 2019-04-09 NOTE — ED Triage Notes (Signed)
Pt sts site reaction to area on abd where pt was given MS med injection last week; pt sts increased pain in area

## 2019-04-09 NOTE — Discharge Instructions (Addendum)
Recommend start Bactrim Twice a day as directed. Wash area with soap and water and apply topical Bacitracin or similar antibiotic ointment and cover with gauze/bandage. Follow-up if redness, swelling or discharge worsen. Otherwise, follow-up with your MS doctor as planned.

## 2019-05-13 NOTE — Progress Notes (Signed)
PATIENT: Haley Mitchell DOB: 04-10-1981  REASON FOR VISIT: follow up HISTORY FROM: patient  Chief Complaint  Patient presents with  . Follow-up    Treatment room, alone.  MS f/u "Doing well, "     HISTORY OF PRESENT ILLNESS: Today 05/14/19 Haley Mitchell is a 38 y.o. female here today for follow up of MS on Rebif injections. She has tolerated injections well with the exception of a skin infection of right lower abdomen last month. She was treated with oral and topical abx. She reports injections in arms and legs have never caused any issues. She continues gabapentin 600 mg at bedtime for left lower extremity numbness. She does feel that this helps. Recently started on HCTZ for elevated BP. She has not monitored BP at home. She has had more urgency and incontinent episodes since. She denies new numbness, weakness, vision or gait changes.   HISTORY: (copied from Dr Bonnita HollowSater's note on 09/22/2018)  Haley Mitchell is a 38 year old woman who was diagnosed with MS in December 2018  Update 09/22/2018: She is on Rebif and she tolerates it well.    No more relapses since 08/2017, a few weeks after she started Rebif.     Her vision has returned to normal and colors are not desaturated.     She is walking well and can do stairs.   Strength and sensation are doing ok.   She has some dysesthesias in hr feet if she stands a long time and painful tingling some nights.   Bladder function is fine.  She has fatigue daily.   She is getting 6 hours sleep on average.  She has a lot of variability in bedtime, going to bed earlier if tired.   She usually falls asleep easily and stays asleep.    She snores but no definite OSA sign.     She takes armodafinil with benefit.       EPWORTH SLEEPINESS SCALE  On a scale of 0 - 3 what is the chance of dozing:  Sitting and Reading:                           0 Watching TV:                                      1 Sitting inactive in a public place:        0 Passenger  in car for one hour:           3 Lying down to rest in the afternoon:   3 Sitting and talking to someone:          0 Sitting quietly after lunch:                   0 In a car, stopped in traffic:                  0  Total (out of 24):   7/24   Normal.   Update 02/02/2018: She started Rebif in February 2019 and had a small exacerbation 6-7 weeks later with right optic neuritis.Marland Kitchen.  Her initial neurologic event was in 2016 with numbness and weakness in her left leg x 2 weeks and then in 04/2017 she had facial numbness leading to an MRI c/w MS.     She feels her vision is practically to  baseline though we note mild color vision asymmetry, less vivid OS.  She feels gait, strength and sensation are all normal now.    Gabapentin has helped her mild dysesthesia.    Bladder is fine.  She had fatigue and armodafinil was started with benefit.    Mood is fine and cognition is doing well.  She has right lower back pain going into the buttocks and down the back to mid thigh.      Update 10/31/2017: She is tolerating Rebif well.  She has been on x 2 months now (started early February 2000)  She has some flulike symptoms after her shots but these are generally mild.  She has recovered well from her initial exacerbation.  She no longer has tingling in the face or legs.   Her gait is fine and she can climb stairs.   She still stumbles some but no falls.     Bladder function is fine.   The right eye is blurry x 2 weeks.     She is experiencing more fatigue, physical and cognitive.    She falls asleep well but sometimes wakes up and has trouble getting back asleep.   However, even after a good nights sleep she is tired.   She does not have sleepiness.    She denies depression.   She notes that she is mildly forgetful.      Update to 08/02/2017: Since the last visit, she has had MRIs of the spine. It shows a definite plaque to the left at T1 and a more subtle focus to the right at C3-C4, both consistent with  demyelinating lesions as would be seen with multiple sclerosis.  The focus at T1 to the left can explain the symptoms that occurred in her left leg 2 years ago. In retrospect she also recalls one year ago having some heaviness in the right arm for about a week. In October 2018 she had a definite exacerbation with numbness and Combined with her symptoms and her abnormal brain MRI, we can be fairly certain of the diagnosis of multiple sclerosis and she meets McDonald criteria.   She is here today to discuss the possible DMTs.  We had a long discussion about treatment options for her newly diagnosed MS going over efficacy, safety and tolerability.. Her presentation is of medium aggressiveness and she would be a candidate for many different medications. Due to the long safety records of the interferons, she would prefer to start with Rebif. She will consider one of the oral agents or one of the IV medications if she has breakthrough activity.  From 06/21/2017: Around 05/16/2017 she woke up with numbness and side of the face, tongue and lip. She presented to the emergency room 05/19/2017. There was no weakness but words were slightly slurred.    No numbness, weakness or clumsiness elsewhere.    An MRI of the brain, that I personally reviewed, was performed showing about a half dozen T2/FLAIR hyperintense foci, 3 of which were periventricular, radially oriented to the ventricles.   None of the foci enhanced after contrast.   She was prescribed prednisone pills. Over the next week the numbness resolved. She does not have any facial numbness now.  In retrospect, about 2 years ago, while she was living in [place], she had the onset of numbness and stiffness with spasms in the left leg. At that time, there were no symptoms in the right leg. Over a couple weeks the symptoms improved but she continues  to get some intermittent numbness. More recently she has noted numbness and restlessness in her legs when she lays  down at night. If she moves around the symptoms improve.  In 2017, she had the vertical sleeve bariatric operation. She lost 65 pounds quickly. Of note, the episode of numbness preceded her operation.   She notes no problems with her vision or her bladder.     She notes fatigue but this has been a problem x years.   She sleeps poorly some nights, sometimes because her legs are numb.   She denies any mood issues or problems with cognition.  She denies any family history of multiple sclerosis. Her mother has psoriasis.   REVIEW OF SYSTEMS: Out of a complete 14 system review of symptoms, the patient complains only of the following symptoms, numbness, urinary frequency and urgency, incontinence and all other reviewed systems are negative.  ALLERGIES: No Known Allergies  HOME MEDICATIONS: Outpatient Medications Prior to Visit  Medication Sig Dispense Refill  . gabapentin (NEURONTIN) 600 MG tablet TAKE HALF TO 1 TABLET BY MOUTH AT BEDTIME 90 tablet 5  . hydrochlorothiazide (MICROZIDE) 12.5 MG capsule Take 12.5 mg by mouth daily.    . Interferon Beta-1a (REBIF REBIDOSE) 44 MCG/0.5ML SOAJ Inject 44 mcg into the skin 3 (three) times a week.     No facility-administered medications prior to visit.     PAST MEDICAL HISTORY: Past Medical History:  Diagnosis Date  . Headache   . Plantar fasciitis   . Vision abnormalities     PAST SURGICAL HISTORY: Past Surgical History:  Procedure Laterality Date  . c section    . GASTRIC BYPASS      FAMILY HISTORY: Family History  Problem Relation Age of Onset  . Hypertension Mother   . Heart disease Mother     SOCIAL HISTORY: Social History   Socioeconomic History  . Marital status: Married    Spouse name: Not on file  . Number of children: Not on file  . Years of education: Not on file  . Highest education level: Not on file  Occupational History  . Not on file  Social Needs  . Financial resource strain: Not on file  . Food  insecurity    Worry: Not on file    Inability: Not on file  . Transportation needs    Medical: Not on file    Non-medical: Not on file  Tobacco Use  . Smoking status: Never Smoker  . Smokeless tobacco: Never Used  Substance and Sexual Activity  . Alcohol use: Yes    Comment: social  . Drug use: No  . Sexual activity: Not on file  Lifestyle  . Physical activity    Days per week: Not on file    Minutes per session: Not on file  . Stress: Not on file  Relationships  . Social Musician on phone: Not on file    Gets together: Not on file    Attends religious service: Not on file    Active member of club or organization: Not on file    Attends meetings of clubs or organizations: Not on file    Relationship status: Not on file  . Intimate partner violence    Fear of current or ex partner: Not on file    Emotionally abused: Not on file    Physically abused: Not on file    Forced sexual activity: Not on file  Other Topics Concern  . Not  on file  Social History Narrative  . Not on file      PHYSICAL EXAM  Vitals:   05/14/19 1314  BP: (!) 137/96  Pulse: 90  Temp: (!) 97.5 F (36.4 C)  Weight: 237 lb 9.6 oz (107.8 kg)  Height: 5' (1.524 m)   Body mass index is 46.4 kg/m.  Generalized: Well developed, in no acute distress  Cardiology: normal rate and rhythm, no murmur noted Neurological examination  Mentation: Alert oriented to time, place, history taking. Follows all commands speech and language fluent Cranial nerve II-XII: Pupils were equal round reactive to light. Extraocular movements were full, visual field were full on confrontational test. Facial sensation and strength were normal. Uvula tongue midline. Head turning and shoulder shrug  were normal and symmetric. Motor: The motor testing reveals 5 over 5 strength of all 4 extremities. Good symmetric motor tone is noted throughout.  Sensory: Sensory testing is intact to soft touch on all 4 extremities.  No evidence of extinction is noted.  Coordination: Cerebellar testing reveals good finger-nose-finger and heel-to-shin bilaterally.  Gait and station: Gait is normal. Tandem gait is normal. Romberg is negative. No drift is seen.    DIAGNOSTIC DATA (LABS, IMAGING, TESTING) - I reviewed patient records, labs, notes, testing and imaging myself where available.  No flowsheet data found.   Lab Results  Component Value Date   WBC 3.4 09/22/2018   HGB 12.1 09/22/2018   HCT 36.8 09/22/2018   MCV 89 09/22/2018   PLT 267 09/22/2018      Component Value Date/Time   NA 139 09/22/2018 1034   K 4.3 09/22/2018 1034   CL 105 09/22/2018 1034   CO2 21 09/22/2018 1034   GLUCOSE 81 09/22/2018 1034   GLUCOSE 83 05/19/2017 0950   BUN 10 09/22/2018 1034   CREATININE 0.91 09/22/2018 1034   CALCIUM 9.2 09/22/2018 1034   PROT 7.3 09/22/2018 1034   ALBUMIN 4.1 09/22/2018 1034   AST 10 09/22/2018 1034   ALT 12 09/22/2018 1034   ALKPHOS 75 09/22/2018 1034   BILITOT 0.3 09/22/2018 1034   GFRNONAA 81 09/22/2018 1034   GFRAA 93 09/22/2018 1034   No results found for: CHOL, HDL, LDLCALC, LDLDIRECT, TRIG, CHOLHDL No results found for: [id number] Lab Results  Component Value Date   VITAMINB12 853 06/21/2017   No results found for: TSH     ASSESSMENT AND PLAN 38 y.o. year old female  has a past medical history of Headache, Plantar fasciitis, and Vision abnormalities. here with     ICD-10-CM   1. Multiple sclerosis (HCC)  G35 CBC with Differential/Platelets    CMP  2. Left leg numbness  R20.0   3. Urinary urgency  R39.15     Ms. Paris is doing well overall.  MS symptoms are fairly stable.  We will continue Rebif as prescribed.  She was asked to reach out for any additional skin concerns.  She will continue gabapentin for leg pain.  We will start oxybutynin for urinary urgency, frequency and incontinence.  She will monitor blood pressures closely at home and follow-up with PCP as directed.  Side  effects discussed with patient in the office today.  We will update labs.  MRI in 09/2018 unchanged from prior MRI in 04/2017.  She will follow-up with Dr. Purvis SheffieldSager in 6 months, sooner if needed.  She verbalizes understanding and agreement with this plan.   Orders Placed This Encounter  Procedures  . CBC with Differential/Platelets  . CMP  Meds ordered this encounter  Medications  . oxybutynin (DITROPAN-XL) 5 MG 24 hr tablet    Sig: Take 1 tablet (5 mg total) by mouth at bedtime.    Dispense:  30 tablet    Refill:  11    Order Specific Question:   Supervising Provider    Answer:   Melvenia Beam [id number]      I spent 15 minutes with the patient. 50% of this time was spent counseling and educating patient on plan of care and medications.    Debbora Presto, FNP-C 05/14/2019, 1:56 PM [facility] [address], [place], [place] [address] [phone]

## 2019-05-14 ENCOUNTER — Other Ambulatory Visit: Payer: Self-pay

## 2019-05-14 ENCOUNTER — Encounter: Payer: Self-pay | Admitting: Family Medicine

## 2019-05-14 ENCOUNTER — Ambulatory Visit (INDEPENDENT_AMBULATORY_CARE_PROVIDER_SITE_OTHER): Payer: 59 | Admitting: Family Medicine

## 2019-05-14 VITALS — BP 137/96 | HR 90 | Temp 97.5°F | Ht 60.0 in | Wt 237.6 lb

## 2019-05-14 DIAGNOSIS — R3915 Urgency of urination: Secondary | ICD-10-CM

## 2019-05-14 DIAGNOSIS — G35 Multiple sclerosis: Secondary | ICD-10-CM

## 2019-05-14 DIAGNOSIS — R200 Anesthesia of skin: Secondary | ICD-10-CM | POA: Diagnosis not present

## 2019-05-14 MED ORDER — OXYBUTYNIN CHLORIDE ER 5 MG PO TB24: 5.0000 mg | ORAL_TABLET | Freq: Every day | ORAL | 11 refills | Status: DC

## 2019-05-14 NOTE — Patient Instructions (Addendum)
We will continue Rebif as prescribed. Please let me know if you have any other concerns of skin irritation or infection  Continue gabapentin for leg pain  We will start oxybutynin for urinary urgency and frequency, please call with any unwanted side effects or worsening symptoms   Labs today  Follow up with Dr Felecia Shelling in 6 months  Oxybutynin extended-release tablets What is this medicine? OXYBUTYNIN (ox i BYOO ti nin) is used to treat overactive bladder. This medicine reduces the amount of bathroom visits. It may also help to control wetting accidents. This medicine may be used for other purposes; ask your health care provider or pharmacist if you have questions. COMMON BRAND NAME(S): Ditropan XL What should I tell my health care provider before I take this medicine? They need to know if you have any of these conditions:  autonomic neuropathy  dementia  difficulty passing urine  glaucoma  intestinal obstruction  kidney disease  liver disease  myasthenia gravis  Parkinson's disease  an unusual or allergic reaction to oxybutynin, other medicines, foods, dyes, or preservatives  pregnant or trying to get pregnant  breast-feeding How should I use this medicine? Take this medicine by mouth with a glass of water. Swallow whole, do not crush, cut, or chew. Follow the directions on the prescription label. You can take this medicine with or without food. Take your doses at regular intervals. Do not take your medicine more often than directed. Talk to your pediatrician regarding the use of this medicine in children. Special care may be needed. While this drug may be prescribed for children as young as 6 years for selected conditions, precautions do apply. Overdosage: If you think you have taken too much of this medicine contact a poison control center or emergency room at once. NOTE: This medicine is only for you. Do not share this medicine with others. What if I miss a dose? If you  miss a dose, take it as soon as you can. If it is almost time for your next dose, take only that dose. Do not take double or extra doses. What may interact with this medicine?  antihistamines for allergy, cough and cold  atropine  certain medicines for bladder problems like oxybutynin, tolterodine  certain medicines for Parkinson's disease like benztropine, trihexyphenidyl  certain medicines for stomach problems like dicyclomine, hyoscyamine  certain medicines for travel sickness like scopolamine  clarithromycin  erythromycin  ipratropium  medicines for fungal infections, like fluconazole, itraconazole, ketoconazole or voriconazole This list may not describe all possible interactions. Give your health care provider a list of all the medicines, herbs, non-prescription drugs, or dietary supplements you use. Also tell them if you smoke, drink alcohol, or use illegal drugs. Some items may interact with your medicine. What should I watch for while using this medicine? It may take a few weeks to notice the full benefit from this medicine. You may need to limit your intake tea, coffee, caffeinated sodas, and alcohol. These drinks may make your symptoms worse. You may get drowsy or dizzy. Do not drive, use machinery, or do anything that needs mental alertness until you know how this medicine affects you. Do not stand or sit up quickly, especially if you are an older patient. This reduces the risk of dizzy or fainting spells. Alcohol may interfere with the effect of this medicine. Avoid alcoholic drinks. Your mouth may get dry. Chewing sugarless gum or sucking hard candy, and drinking plenty of water may help. Contact your doctor if the  problem does not go away or is severe. This medicine may cause dry eyes and blurred vision. If you wear contact lenses, you may feel some discomfort. Lubricating drops may help. See your eyecare professional if the problem does not go away or is severe. You may  notice the shells of the tablets in your stool from time to time. This is normal. Avoid extreme heat. This medicine can cause you to sweat less than normal. Your body temperature could increase to dangerous levels, which may lead to heat stroke. What side effects may I notice from receiving this medicine? Side effects that you should report to your doctor or health care professional as soon as possible:  allergic reactions like skin rash, itching or hives, swelling of the face, lips, or tongue  agitation  breathing problems  confusion  fever  flushing (reddening of the skin)  hallucinations  memory loss  pain or difficulty passing urine  palpitations  unusually weak or tired Side effects that usually do not require medical attention (report to your doctor or health care professional if they continue or are bothersome):  constipation  headache  sexual difficulties (impotence) This list may not describe all possible side effects. Call your doctor for medical advice about side effects. You may report side effects to FDA at 1-800-FDA-1088. Where should I keep my medicine? Keep out of the reach of children. Store at room temperature between 15 and 30 degrees C (59 and 86 degrees F). Protect from moisture and humidity. Throw away any unused medicine after the expiration date. NOTE: This sheet is a summary. It may not cover all possible information. If you have questions about this medicine, talk to your doctor, pharmacist, or health care provider.  2020 Elsevier/Gold Standard (2013-09-27 10:57:06)   Multiple Sclerosis Multiple sclerosis (MS) is a disease of the brain, spinal cord, and optic nerves (central nervous system). It causes the body's disease-fighting (immune) system to destroy the protective covering (myelin sheath) around nerves in the brain. When this happens, signals (nerve impulses) going to and from the brain and spinal cord do not get sent properly or may not get  sent at all. There are several types of MS:  Relapsing-remitting MS. This is the most common type. This causes sudden attacks of symptoms. After an attack, you may recover completely until the next attack, or some symptoms may remain permanently.  Secondary progressive MS. This usually develops after the onset of relapsing-remitting MS. Similar to relapsing-remitting MS, this type also causes sudden attacks of symptoms. Attacks may be less frequent, but symptoms slowly get worse (progress) over time.  Primary progressive MS. This causes symptoms that steadily progress over time. This type of MS does not cause sudden attacks of symptoms. The age of onset of MS varies, but it often develops between 3220-38 years of age. MS is a lifelong (chronic) condition. There is no cure, but treatment can help slow down the progression of the disease. What are the causes? The cause of this condition is not known. What increases the risk? You are more likely to develop this condition if:  You are a woman.  You have a relative with MS. However, the condition is not passed from parent to child (inherited).  You have a lack (deficiency) of vitamin D.  You smoke. MS is more common in the [place] than in the [place]. What are the signs or symptoms? Relapsing-remitting and secondary progressive MS cause symptoms to occur in episodes or attacks  that may last weeks to months. There may be long periods between attacks in which there are almost no symptoms. Primary progressive MS causes symptoms to steadily progress after they develop. Symptoms of MS vary because of the many different ways it affects the central nervous system. The main symptoms include:  Vision problems and eye pain.  Numbness.  Weakness.  Inability to move your arms, hands, feet, or legs (paralysis).  Balance problems.  Shaking that you cannot control (tremors).  Muscle spasms.  Problems with thinking  (cognitive changes). MS can also cause symptoms that are associated with the disease, but are not always the direct result of an MS attack. They may include:  Inability to control urination or bowel movements (incontinence).  Headaches.  Fatigue.  Inability to tolerate heat.  Emotional changes.  Depression.  Pain. How is this diagnosed? This condition is diagnosed based on:  Your symptoms.  A neurological exam. This involves checking central nervous system function, such as nerve function, reflexes, and coordination.  MRIs of the brain and spinal cord.  Lab tests, including a lumbar puncture that tests the fluid that surrounds the brain and spinal cord (cerebrospinal fluid).  Tests to measure the electrical activity of the brain in response to stimulation (evoked potentials). How is this treated? There is no cure for MS, but medicines can help decrease the number and frequency of attacks and help relieve nuisance symptoms. Treatment options may include:  Medicines that reduce the frequency of attacks. These medicines may be given by injection, by mouth (orally), or through an IV.  Medicines that reduce inflammation (steroids). These may provide short-term relief of symptoms.  Medicines to help control pain, depression, fatigue, or incontinence.  Vitamin D, if you have a deficiency.  Using devices to help you move around (assistive devices), such as braces, a cane, or a walker.  Physical therapy to strengthen and stretch your muscles.  Occupational therapy to help you with everyday tasks.  Alternative or complementary treatments such as exercise, massage, or acupuncture. Follow these instructions at home:  Take over-the-counter and prescription medicines only as told by your health care provider.  Do not drive or use heavy machinery while taking prescription pain medicine.  Use assistive devices as recommended by your physical therapist or your health care provider.   Exercise as directed by your health care provider.  Return to your normal activities as told by your health care provider. Ask your health care provider what activities are safe for you.  Reach out for support. Share your feelings with friends, family, or a support group.  Keep all follow-up visits as told by your health care provider and therapists. This is important. Where to find more information  National Multiple Sclerosis Society: https://www.nationalmssociety.org Contact a health care provider if:  You feel depressed.  You develop new pain or numbness.  You have tremors.  You have problems with sexual function. Get help right away if:  You develop paralysis.  You develop numbness.  You have problems with your bladder or bowel function.  You develop double vision.  You lose vision in one or both eyes.  You develop suicidal thoughts.  You develop severe confusion. If you ever feel like you may hurt yourself or others, or have thoughts about taking your own life, get help right away. You can go to your nearest emergency department or call:  Your local emergency services (911 in the [place]  A suicide crisis helpline, such as the [employer] [facility]  at (684)481-56361-(973) 311-4413. This is open 24 hours a day. Summary  Multiple sclerosis (MS) is a disease of the central nervous system that causes the body's immune system to destroy the protective covering (myelin sheath) around nerves in the brain.  There are 3 types of MS: relapsing-remitting, secondary progressive, and primary progressive. Relapsing-remitting and secondary progressive MS cause symptoms to occur in episodes or attacks that may last weeks to months. Primary progressive MS causes symptoms to steadily progress after they develop.  There is no cure for MS, but medicines can help decrease the number and frequency of attacks and help relieve nuisance symptoms. Treatment may also include physical or  occupational therapy.  If you develop numbness, paralysis, vision problems, or other neurological symptoms, get help right away. This information is not intended to replace advice given to you by your health care provider. Make sure you discuss any questions you have with your health care provider. Document Released: 07/09/2000 Document Revised: 06/24/2017 Document Reviewed: 09/20/2016 Elsevier Patient Education  2020 [employer]

## 2019-05-14 NOTE — Progress Notes (Signed)
I have read the note, and I agree with the clinical assessment and plan.  Richard A. Sater, MD, PhD, FAAN Certified in Neurology, Clinical Neurophysiology, Sleep Medicine, Pain Medicine and Neuroimaging  [facility] [address], [address] [place], [place] [address] [phone]  

## 2019-05-15 ENCOUNTER — Telehealth: Payer: Self-pay

## 2019-05-15 LAB — COMPREHENSIVE METABOLIC PANEL
ALT: 16 IU/L (ref 0–32)
AST: 16 IU/L (ref 0–40)
Albumin/Globulin Ratio: 1.4 (ref 1.2–2.2)
Albumin: 4.4 g/dL (ref 3.8–4.8)
Alkaline Phosphatase: 80 IU/L (ref 39–117)
BUN/Creatinine Ratio: 15 (ref 9–23)
BUN: 11 mg/dL (ref 6–20)
Bilirubin Total: 0.3 mg/dL (ref 0.0–1.2)
CO2: 22 mmol/L (ref 20–29)
Calcium: 9.3 mg/dL (ref 8.7–10.2)
Chloride: 107 mmol/L — ABNORMAL HIGH (ref 96–106)
Creatinine, Ser: 0.74 mg/dL (ref 0.57–1.00)
GFR calc Af Amer: 119 mL/min/{1.73_m2} (ref >59)
GFR calc non Af Amer: 103 mL/min/{1.73_m2} (ref >59)
Globulin, Total: 3.1 g/dL (ref 1.5–4.5)
Glucose: 84 mg/dL (ref 65–99)
Potassium: 4.4 mmol/L (ref 3.5–5.2)
Sodium: 143 mmol/L (ref 134–144)
Total Protein: 7.5 g/dL (ref 6.0–8.5)

## 2019-05-15 LAB — CBC WITH DIFFERENTIAL/PLATELET
Basophils Absolute: 0 10*3/uL (ref 0.0–0.2)
Basos: 1 %
EOS (ABSOLUTE): 0 10*3/uL (ref 0.0–0.4)
Eos: 0 %
Hematocrit: 37.1 % (ref 34.0–46.6)
Hemoglobin: 12.5 g/dL (ref 11.1–15.9)
Immature Grans (Abs): 0 10*3/uL (ref 0.0–0.1)
Immature Granulocytes: 0 %
Lymphocytes Absolute: 1.8 10*3/uL (ref 0.7–3.1)
Lymphs: 43 %
MCH: 29.8 pg (ref 26.6–33.0)
MCHC: 33.7 g/dL (ref 31.5–35.7)
MCV: 89 fL (ref 79–97)
Monocytes Absolute: 0.3 10*3/uL (ref 0.1–0.9)
Monocytes: 7 %
Neutrophils Absolute: 2.1 10*3/uL (ref 1.4–7.0)
Neutrophils: 49 %
Platelets: 253 10*3/uL (ref 150–450)
RBC: 4.19 x10E6/uL (ref 3.77–5.28)
RDW: 12.9 % (ref 11.7–15.4)
WBC: 4.2 10*3/uL (ref 3.4–10.8)

## 2019-05-15 NOTE — Telephone Encounter (Signed)
Called pt to inform her of the results of her most recent labs. Everything appears to be normal.

## 2019-05-31 ENCOUNTER — Telehealth: Payer: Self-pay | Admitting: *Deleted

## 2019-05-31 NOTE — Telephone Encounter (Signed)
Faxed back signed refill for Rebif rebidose 41mcg #36, 3 refills to [facility] at [phone]. Received fax confirmation

## 2019-06-28 ENCOUNTER — Other Ambulatory Visit: Payer: Self-pay | Admitting: *Deleted

## 2019-06-28 ENCOUNTER — Encounter: Payer: Self-pay | Admitting: Family Medicine

## 2019-06-28 ENCOUNTER — Encounter: Payer: Self-pay | Admitting: Neurology

## 2019-06-28 MED ORDER — OXYBUTYNIN CHLORIDE ER 5 MG PO TB24: 5.0000 mg | ORAL_TABLET | Freq: Every day | ORAL | 11 refills | Status: DC

## 2019-08-06 ENCOUNTER — Ambulatory Visit (HOSPITAL_BASED_OUTPATIENT_CLINIC_OR_DEPARTMENT_OTHER): Payer: Commercial Managed Care - PPO | Admitting: Pharmacist

## 2019-08-06 ENCOUNTER — Other Ambulatory Visit: Payer: Self-pay | Admitting: *Deleted

## 2019-08-06 ENCOUNTER — Other Ambulatory Visit: Payer: Self-pay

## 2019-08-06 DIAGNOSIS — G35 Multiple sclerosis: Secondary | ICD-10-CM

## 2019-08-06 DIAGNOSIS — Z7189 Other specified counseling: Secondary | ICD-10-CM

## 2019-08-06 MED ORDER — REBIF REBIDOSE 44 MCG/0.5ML ~~LOC~~ SOAJ: 44.0000 ug | SUBCUTANEOUS | 11 refills | Status: DC

## 2019-08-06 NOTE — Progress Notes (Signed)
Virtual Visit via Telephone Note  I connected withAntilia Barroson1/11/2021at2:50 PMby telephonedue to the COVID-19 pandemic and verified that I am speaking with the correct person using two identifiers.  Consent: I discussed the limitations, risks, security and privacy concerns of performing an evaluation and management service by telephone and the availability of in person appointments.Pt was agreeable to proceed.  Location of Patient: Home  Location of Provider: Clinic  Persons participating in Telemedicine visit: Patient Myself    S: Patient presents for review of their specialty medication therapy.  Patient is currently taking Rebif for multiple sclerosis (MS). Patient is managed by Despina Arias for this.   Adherence: confirms  Efficacy: reports that it it working well for her  Dosing: Rebif (subQ): 44 mcg 3 times weekly   Dose adjustments: Renal: no dose adjustments  Hepatic: There are no dosage adjustment provided in the manufacturer's labeling; use with caution in patients with active liver disease, alcohol abuse, ALT >2.5 x ULN, or a history of significant liver disease. Rebif [place] labeling contraindicates use in decompensated liver disease.  Dose adjustments for toxicities: Autoimmune disorder development: Consider discontinuing treatment. Depression or other severe psychiatric symptoms: Consider discontinuing treatment. Hepatotoxicity: ALT >5 x ULN: Temporarily discontinue therapy or consider dose reduction until ALT normalizes, then may consider retitration of dose. Symptomatic (eg, jaundice): Discontinue immediately. Leukopenia: May require temporary discontinuation or dose reduction until resolution  Drug-drug interactions: none identified  Monitoring: Blood work monitored by Dr. Epimenio Foot 2x/yr - last performed 04/2019 CBC: WNL, per pt LFTs: WNL, per pt Thyroid function tests: WNL, per pt Injection site reactions: denies  S/sx of  malignancy: denies  Neuropsychiatric symptoms: denies  Thrombotic microangiopathy (monitor for new onset HTN, thrombocytopenia, or impaired renal dysfunction): denies symptoms; blood work WNL, per pt  O:  Lab Results  Component Value Date   WBC 4.2 05/14/2019   HGB 12.5 05/14/2019   HCT 37.1 05/14/2019   MCV 89 05/14/2019   PLT 253 05/14/2019      Chemistry      Component Value Date/Time   NA 143 05/14/2019 1403   K 4.4 05/14/2019 1403   CL 107 (H) 05/14/2019 1403   CO2 22 05/14/2019 1403   BUN 11 05/14/2019 1403   CREATININE 0.74 05/14/2019 1403      Component Value Date/Time   CALCIUM 9.3 05/14/2019 1403   ALKPHOS 80 05/14/2019 1403   AST 16 05/14/2019 1403   ALT 16 05/14/2019 1403   BILITOT 0.3 05/14/2019 1403     No results found for: TSH  A/P: 1. Medication review: Patient currently on Rebif for the treatment of MS and is tolerating it well. Reviewed the medication with the patient, including the following: Rebif, interferon beta-1a, is an interferon indicated for the treatment of MS. Patient educated on purpose, proper use and potential adverse effects of Rebif. Analgesics and/or antipyretics may help decrease flu-like symptoms on treatment days. Possible adverse effects include changes to thyroid or liver, injection site reactions, increased risk of malignancy, and neuropsychiatric symptoms.  Administer SubQ at the same time of day on the same 3 days each week (eg, Mon, Wed, Fri), preferably in the late afternoon or evening; doses should be at least 48 hours apart; rotate injection site; do not inject into area where skin is irritated, red, bruised, or scarred. Discard any unused portion. No recommendations for any changes.  Butch Penny, PharmD, CPP Clinical Pharmacist [facility] [phone]

## 2019-08-21 ENCOUNTER — Telehealth: Payer: Self-pay

## 2019-08-21 NOTE — Telephone Encounter (Signed)
PA for rebif has been completed and sent to Cover my meds.  Key: [id number] - PA Case ID: [id number] - Rx #: [id number]

## 2019-09-10 ENCOUNTER — Telehealth: Payer: Self-pay | Admitting: *Deleted

## 2019-09-10 NOTE — Telephone Encounter (Signed)
Spoke to [place], Sports coach for [employer].  She stated that pt is all set.  Using [facility].  (was initially on Free durg for one month, the insurance sorted out and is now getting commerically).  Nothing more needed from [place].  I appreciated call back.  [phone].

## 2019-09-10 NOTE — Telephone Encounter (Signed)
Calling about pts approval and needing other information (prescription).  Pharmacy?  Case manager Morrie Sheldon to call me back.

## 2019-09-14 ENCOUNTER — Ambulatory Visit: Payer: 59 | Admitting: Obstetrics and Gynecology

## 2019-09-14 DIAGNOSIS — Z0289 Encounter for other administrative examinations: Secondary | ICD-10-CM

## 2019-10-01 ENCOUNTER — Encounter: Payer: Self-pay | Admitting: Family Medicine

## 2019-10-08 ENCOUNTER — Other Ambulatory Visit: Payer: Self-pay | Admitting: *Deleted

## 2019-10-08 MED ORDER — GABAPENTIN 600 MG PO TABS: ORAL_TABLET | ORAL | 5 refills | Status: DC

## 2019-11-12 ENCOUNTER — Ambulatory Visit (INDEPENDENT_AMBULATORY_CARE_PROVIDER_SITE_OTHER): Payer: 59 | Admitting: Family Medicine

## 2019-11-12 ENCOUNTER — Other Ambulatory Visit: Payer: Self-pay

## 2019-11-12 ENCOUNTER — Encounter: Payer: Self-pay | Admitting: Family Medicine

## 2019-11-12 VITALS — BP 145/99 | HR 95 | Temp 97.9°F | Ht 61.0 in | Wt 234.0 lb

## 2019-11-12 DIAGNOSIS — R3915 Urgency of urination: Secondary | ICD-10-CM | POA: Diagnosis not present

## 2019-11-12 DIAGNOSIS — G35 Multiple sclerosis: Secondary | ICD-10-CM

## 2019-11-12 DIAGNOSIS — J302 Other seasonal allergic rhinitis: Secondary | ICD-10-CM

## 2019-11-12 DIAGNOSIS — G2581 Restless legs syndrome: Secondary | ICD-10-CM | POA: Diagnosis not present

## 2019-11-12 DIAGNOSIS — Z79899 Other long term (current) drug therapy: Secondary | ICD-10-CM

## 2019-11-12 MED ORDER — LEVOCETIRIZINE DIHYDROCHLORIDE 5 MG PO TABS: 5.0000 mg | ORAL_TABLET | Freq: Every evening | ORAL | 11 refills | Status: DC

## 2019-11-12 NOTE — Patient Instructions (Addendum)
Continue Rebif as prescribed. We will update labs today.   Continue gabapentin and oxybutynin as prescribed.   Try Xyzal daily for itching and allergy symptoms. Follow up closely with PCP for worsening.  Follow up with [place] in 6 months    Multiple Sclerosis Multiple sclerosis (MS) is a disease of the brain, spinal cord, and optic nerves (central nervous system). It causes the body's disease-fighting (immune) system to destroy the protective covering (myelin sheath) around nerves in the brain. When this happens, signals (nerve impulses) going to and from the brain and spinal cord do not get sent properly or may not get sent at all. There are several types of MS:  Relapsing-remitting MS. This is the most common type. This causes sudden attacks of symptoms. After an attack, you may recover completely until the next attack, or some symptoms may remain permanently.  Secondary progressive MS. This usually develops after the onset of relapsing-remitting MS. Similar to relapsing-remitting MS, this type also causes sudden attacks of symptoms. Attacks may be less frequent, but symptoms slowly get worse (progress) over time.  Primary progressive MS. This causes symptoms that steadily progress over time. This type of MS does not cause sudden attacks of symptoms. The age of onset of MS varies, but it often develops between 61-64 years of age. MS is a lifelong (chronic) condition. There is no cure, but treatment can help slow down the progression of the disease. What are the causes? The cause of this condition is not known. What increases the risk? You are more likely to develop this condition if:  You are a woman.  You have a relative with MS. However, the condition is not passed from parent to child (inherited).  You have a lack (deficiency) of vitamin D.  You smoke. MS is more common in the [place] than in the [place]. What are the signs or  symptoms? Relapsing-remitting and secondary progressive MS cause symptoms to occur in episodes or attacks that may last weeks to months. There may be long periods between attacks in which there are almost no symptoms. Primary progressive MS causes symptoms to steadily progress after they develop. Symptoms of MS vary because of the many different ways it affects the central nervous system. The main symptoms include:  Vision problems and eye pain.  Numbness.  Weakness.  Inability to move your arms, hands, feet, or legs (paralysis).  Balance problems.  Shaking that you cannot control (tremors).  Muscle spasms.  Problems with thinking (cognitive changes). MS can also cause symptoms that are associated with the disease, but are not always the direct result of an MS attack. They may include:  Inability to control urination or bowel movements (incontinence).  Headaches.  Fatigue.  Inability to tolerate heat.  Emotional changes.  Depression.  Pain. How is this diagnosed? This condition is diagnosed based on:  Your symptoms.  A neurological exam. This involves checking central nervous system function, such as nerve function, reflexes, and coordination.  MRIs of the brain and spinal cord.  Lab tests, including a lumbar puncture that tests the fluid that surrounds the brain and spinal cord (cerebrospinal fluid).  Tests to measure the electrical activity of the brain in response to stimulation (evoked potentials). How is this treated? There is no cure for MS, but medicines can help decrease the number and frequency of attacks and help relieve nuisance symptoms. Treatment options may include:  Medicines that reduce the frequency of attacks. These medicines may be given  by injection, by mouth (orally), or through an IV.  Medicines that reduce inflammation (steroids). These may provide short-term relief of symptoms.  Medicines to help control pain, depression, fatigue, or  incontinence.  Vitamin D, if you have a deficiency.  Using devices to help you move around (assistive devices), such as braces, a cane, or a walker.  Physical therapy to strengthen and stretch your muscles.  Occupational therapy to help you with everyday tasks.  Alternative or complementary treatments such as exercise, massage, or acupuncture. Follow these instructions at home:  Take over-the-counter and prescription medicines only as told by your health care provider.  Do not drive or use heavy machinery while taking prescription pain medicine.  Use assistive devices as recommended by your physical therapist or your health care provider.  Exercise as directed by your health care provider.  Return to your normal activities as told by your health care provider. Ask your health care provider what activities are safe for you.  Reach out for support. Share your feelings with friends, family, or a support group.  Keep all follow-up visits as told by your health care provider and therapists. This is important. Where to find more information  National Multiple Sclerosis Society: https://www.nationalmssociety.org Contact a health care provider if:  You feel depressed.  You develop new pain or numbness.  You have tremors.  You have problems with sexual function. Get help right away if:  You develop paralysis.  You develop numbness.  You have problems with your bladder or bowel function.  You develop double vision.  You lose vision in one or both eyes.  You develop suicidal thoughts.  You develop severe confusion. If you ever feel like you may hurt yourself or others, or have thoughts about taking your own life, get help right away. You can go to your nearest emergency department or call:  Your local emergency services (911 in the [place]  A suicide crisis helpline, such as the [place] at (320)492-3345. This is open 24 hours a  day. Summary  Multiple sclerosis (MS) is a disease of the central nervous system that causes the body's immune system to destroy the protective covering (myelin sheath) around nerves in the brain.  There are 3 types of MS: relapsing-remitting, secondary progressive, and primary progressive. Relapsing-remitting and secondary progressive MS cause symptoms to occur in episodes or attacks that may last weeks to months. Primary progressive MS causes symptoms to steadily progress after they develop.  There is no cure for MS, but medicines can help decrease the number and frequency of attacks and help relieve nuisance symptoms. Treatment may also include physical or occupational therapy.  If you develop numbness, paralysis, vision problems, or other neurological symptoms, get help right away. This information is not intended to replace advice given to you by your health care provider. Make sure you discuss any questions you have with your health care provider. Document Revised: 06/24/2017 Document Reviewed: 09/20/2016 Elsevier Patient Education  2020 [employer].

## 2019-11-12 NOTE — Progress Notes (Signed)
I have read the note, and I agree with the clinical assessment and plan.  Tekia Waterbury A. Lovelace Cerveny, MD, PhD, FAAN Certified in Neurology, Clinical Neurophysiology, Sleep Medicine, Pain Medicine and Neuroimaging  [facility] [address], [address] [place], [place] [address] [phone]  

## 2019-11-12 NOTE — Progress Notes (Signed)
PATIENT: Haley Mitchell DOB: 1981-04-20  REASON FOR VISIT: follow up HISTORY FROM: patient  Chief Complaint  Patient presents with  . Follow-up    patient states she is feeling good, has a weird itching all over her body but not like a "regular itch", like an itch under her skin that comes and goes. Pt is going to get a new PCP this week to get her BP under control.    Marland Kitchen Room 2    here alone has mask on     HISTORY OF PRESENT ILLNESS: Today 11/12/19 Haley Mitchell is a 39 y.o. female here today for follow up for MS. She continues Rebif injections. She had a change in insurance and was off DMT for about a month but has been back on consistent therapy for the past 3 months. No new or exacerbating symptoms. MRI stable in 09/2018.   She continues gabapentin 300-600mg  at bedtime for RLS and neuropathic pain in her feet. She also continues oxybutynin for urinary frequency and incontinence. She is tolerating medications well with no adverse effects.   She does note intermittent itching. She has noticed this for the past month. No new possible irritants or changes in soaps, detergents. No new medications. She denies rash. She has taken benadryl that did help. Heat seems to make it worse. She does feel that allergies are worse this year. She has had more trouble with congestion, itchy watery eyes, swollen eyes. She takes Claritin intermittently.    HISTORY: (copied from my note on 05/14/2019)  Haley Mitchell is a 39 y.o. female here today for follow up of MS on Rebif injections. She has tolerated injections well with the exception of a skin infection of right lower abdomen last month. She was treated with oral and topical abx. She reports injections in arms and legs have never caused any issues. She continues gabapentin 600 mg at bedtime for left lower extremity numbness. She does feel that this helps. Recently started on HCTZ for elevated BP. She has not monitored BP at home. She has had more  urgency and incontinent episodes since. She denies new numbness, weakness, vision or gait changes.    REVIEW OF SYSTEMS: Out of a complete 14 system review of symptoms, the patient complains only of the following symptoms, itching, urinary frequency and all other reviewed systems are negative.  ALLERGIES: No Known Allergies  HOME MEDICATIONS: Outpatient Medications Prior to Visit  Medication Sig Dispense Refill  . gabapentin (NEURONTIN) 600 MG tablet TAKE HALF TO 1 TABLET BY MOUTH AT BEDTIME 90 tablet 5  . hydrochlorothiazide (MICROZIDE) 12.5 MG capsule Take 12.5 mg by mouth daily.    . Interferon Beta-1a (REBIF REBIDOSE) 44 MCG/0.5ML SOAJ Inject 44 mcg into the skin 3 (three) times a week. 6 mL 11  . oxybutynin (DITROPAN-XL) 5 MG 24 hr tablet Take 1 tablet (5 mg total) by mouth at bedtime. 30 tablet 11   No facility-administered medications prior to visit.    PAST MEDICAL HISTORY: Past Medical History:  Diagnosis Date  . Headache   . Plantar fasciitis   . Vision abnormalities     PAST SURGICAL HISTORY: Past Surgical History:  Procedure Laterality Date  . c section    . GASTRIC BYPASS      FAMILY HISTORY: Family History  Problem Relation Age of Onset  . Hypertension Mother   . Heart disease Mother     SOCIAL HISTORY: Social History   Socioeconomic History  . Marital status: Married  Spouse name: Not on file  . Number of children: 3  . Years of education: Not on file  . Highest education level: Not on file  Occupational History  . Not on file  Tobacco Use  . Smoking status: Never Smoker  . Smokeless tobacco: Never Used  Substance and Sexual Activity  . Alcohol use: Yes    Comment: social  . Drug use: No  . Sexual activity: Not on file  Other Topics Concern  . Not on file  Social History Narrative   Lives at home with her husband and children   Right handed   Caffeine: 1 coffee in AM   Social Determinants of Health   Financial Resource Strain:   .  Difficulty of Paying Living Expenses:   Food Insecurity:   . Worried About Programme researcher, broadcasting/film/video in the Last Year:   . Barista in the Last Year:   Transportation Needs:   . Freight forwarder (Medical):   Marland Kitchen Lack of Transportation (Non-Medical):   Physical Activity:   . Days of Exercise per Week:   . Minutes of Exercise per Session:   Stress:   . Feeling of Stress :   Social Connections:   . Frequency of Communication with Friends and Family:   . Frequency of Social Gatherings with Friends and Family:   . Attends Religious Services:   . Active Member of Clubs or Organizations:   . Attends Banker Meetings:   Marland Kitchen Marital Status:   Intimate Partner Violence:   . Fear of Current or Ex-Partner:   . Emotionally Abused:   Marland Kitchen Physically Abused:   . Sexually Abused:       PHYSICAL EXAM  Vitals:   11/12/19 1404  BP: (!) 145/99  Pulse: 95  Temp: 97.9 F (36.6 C)  Weight: 234 lb (106.1 kg)  Height: 5\' 1"  (1.549 m)   Body mass index is 44.21 kg/m.  Generalized: Well developed, in no acute distress  Cardiology: normal rate and rhythm, no murmur noted Respiratory: clear to auscultation bilaterally  Neurological examination  Mentation: Alert oriented to time, place, history taking. Follows all commands speech and language fluent Cranial nerve II-XII: Pupils were equal round reactive to light. Extraocular movements were full, visual field were full on confrontational test. Facial sensation and strength were normal. Uvula tongue midline. Head turning and shoulder shrug  were normal and symmetric. Motor: The motor testing reveals 5 over 5 strength of all 4 extremities. Good symmetric motor tone is noted throughout.  Sensory: Sensory testing is intact to soft touch on all 4 extremities. No evidence of extinction is noted.  Coordination: Cerebellar testing reveals good finger-nose-finger and heel-to-shin bilaterally.  Gait and station: Gait is normal. Tandem gait is  normal. Romberg is negative. No drift is seen.  Reflexes: Deep tendon reflexes are symmetric and normal bilaterally.   DIAGNOSTIC DATA (LABS, IMAGING, TESTING) - I reviewed patient records, labs, notes, testing and imaging myself where available.  No flowsheet data found.   Lab Results  Component Value Date   WBC 4.2 05/14/2019   HGB 12.5 05/14/2019   HCT 37.1 05/14/2019   MCV 89 05/14/2019   PLT 253 05/14/2019      Component Value Date/Time   NA 143 05/14/2019 1403   K 4.4 05/14/2019 1403   CL 107 (H) 05/14/2019 1403   CO2 22 05/14/2019 1403   GLUCOSE 84 05/14/2019 1403   GLUCOSE 83 05/19/2017 0950   BUN  11 05/14/2019 1403   CREATININE 0.74 05/14/2019 1403   CALCIUM 9.3 05/14/2019 1403   PROT 7.5 05/14/2019 1403   ALBUMIN 4.4 05/14/2019 1403   AST 16 05/14/2019 1403   ALT 16 05/14/2019 1403   ALKPHOS 80 05/14/2019 1403   BILITOT 0.3 05/14/2019 1403   GFRNONAA 103 05/14/2019 1403   GFRAA 119 05/14/2019 1403   No results found for: CHOL, HDL, LDLCALC, LDLDIRECT, TRIG, CHOLHDL No results found for: HGBA1C Lab Results  Component Value Date   GUYQIHKV42 595 06/21/2017   No results found for: TSH     ASSESSMENT AND PLAN 39 y.o. year old female  has a past medical history of Headache, Plantar fasciitis, and Vision abnormalities. here with     ICD-10-CM   1. Multiple sclerosis ([place])  G35 CBC with Differential/Platelets    CMP  2. Urinary urgency  R39.15   3. High risk medication use  Z79.899   4. Restless leg  G25.81   5. Seasonal allergies  J30.2     Haley Mitchell is doing very well.  She continues rebirth injections as prescribed.  She did have a short period of time off DMT but denies any new or exacerbating symptoms.  We will continue review of injections.  She will continue gabapentin and oxybutynin as prescribed for restless legs, neuropathic pain and urinary frequency.  She has noted increased itching of her skin over the past month.  This also correlates with  increased nasal congestion, itchy watery eyes as well as some swelling under her eyes which are typical seasonal allergy symptoms.  We will try Xyzal 5 mg daily for management.  She will monitor for any new rashes or other concerning symptoms.  She will follow-up closely with primary care.  She will return to see [place] in 6 months, sooner if needed.  She verbalizes understanding and agreement with this plan.   Orders Placed This Encounter  Procedures  . CBC with Differential/Platelets  . CMP     Meds ordered this encounter  Medications  . levocetirizine (XYZAL) 5 MG tablet    Sig: Take 1 tablet (5 mg total) by mouth every evening.    Dispense:  30 tablet    Refill:  11    Order Specific Question:   Supervising Provider    Answer:   Melvenia Beam [id number]      I spent 20 minutes with the patient. 50% of this time was spent counseling and educating patient on plan of care and medications.    Debbora Presto, FNP-C 11/12/2019, 2:32 PM [facility] [address], [place], [place] [address] [phone]

## 2019-11-13 LAB — COMPREHENSIVE METABOLIC PANEL
ALT: 6 IU/L (ref 0–32)
AST: 13 IU/L (ref 0–40)
Albumin/Globulin Ratio: 1.5 (ref 1.2–2.2)
Albumin: 4.3 g/dL (ref 3.8–4.8)
Alkaline Phosphatase: 77 IU/L (ref 39–117)
BUN/Creatinine Ratio: 12 (ref 9–23)
BUN: 9 mg/dL (ref 6–20)
Bilirubin Total: 0.3 mg/dL (ref 0.0–1.2)
CO2: 22 mmol/L (ref 20–29)
Calcium: 9 mg/dL (ref 8.7–10.2)
Chloride: 101 mmol/L (ref 96–106)
Creatinine, Ser: 0.73 mg/dL (ref 0.57–1.00)
GFR calc Af Amer: 121 mL/min/{1.73_m2} (ref >59)
GFR calc non Af Amer: 105 mL/min/{1.73_m2} (ref >59)
Globulin, Total: 2.9 g/dL (ref 1.5–4.5)
Glucose: 76 mg/dL (ref 65–99)
Potassium: 3.8 mmol/L (ref 3.5–5.2)
Sodium: 138 mmol/L (ref 134–144)
Total Protein: 7.2 g/dL (ref 6.0–8.5)

## 2019-11-13 LAB — CBC WITH DIFFERENTIAL/PLATELET
Basophils Absolute: 0 10*3/uL (ref 0.0–0.2)
Basos: 1 %
EOS (ABSOLUTE): 0 10*3/uL (ref 0.0–0.4)
Eos: 1 %
Hematocrit: 36.8 % (ref 34.0–46.6)
Hemoglobin: 12.3 g/dL (ref 11.1–15.9)
Immature Grans (Abs): 0 10*3/uL (ref 0.0–0.1)
Immature Granulocytes: 0 %
Lymphocytes Absolute: 1.4 10*3/uL (ref 0.7–3.1)
Lymphs: 40 %
MCH: 29.5 pg (ref 26.6–33.0)
MCHC: 33.4 g/dL (ref 31.5–35.7)
MCV: 88 fL (ref 79–97)
Monocytes Absolute: 0.3 10*3/uL (ref 0.1–0.9)
Monocytes: 9 %
Neutrophils Absolute: 1.8 10*3/uL (ref 1.4–7.0)
Neutrophils: 49 %
Platelets: 257 10*3/uL (ref 150–450)
RBC: 4.17 x10E6/uL (ref 3.77–5.28)
RDW: 12.5 % (ref 11.7–15.4)
WBC: 3.6 10*3/uL (ref 3.4–10.8)

## 2019-11-14 ENCOUNTER — Telehealth: Payer: Self-pay | Admitting: *Deleted

## 2019-11-14 NOTE — Telephone Encounter (Signed)
-----   Message from Shawnie Dapper, NP sent at 11/13/2019  4:20 PM EDT ----- Labs look great

## 2020-01-21 DIAGNOSIS — Z309 Encounter for contraceptive management, unspecified: Secondary | ICD-10-CM | POA: Diagnosis not present

## 2020-01-21 DIAGNOSIS — Z01419 Encounter for gynecological examination (general) (routine) without abnormal findings: Secondary | ICD-10-CM | POA: Diagnosis not present

## 2020-01-21 DIAGNOSIS — N921 Excessive and frequent menstruation with irregular cycle: Secondary | ICD-10-CM | POA: Diagnosis not present

## 2020-02-14 DIAGNOSIS — N921 Excessive and frequent menstruation with irregular cycle: Secondary | ICD-10-CM | POA: Diagnosis not present

## 2020-02-22 DIAGNOSIS — H02051 Trichiasis without entropian right upper eyelid: Secondary | ICD-10-CM | POA: Diagnosis not present

## 2020-02-25 ENCOUNTER — Other Ambulatory Visit: Payer: Self-pay | Admitting: Neurology

## 2020-02-25 MED ORDER — BACLOFEN 10 MG PO TABS: ORAL_TABLET | ORAL | 5 refills | Status: DC

## 2020-04-03 ENCOUNTER — Ambulatory Visit: Payer: 59 | Admitting: Podiatry

## 2020-04-09 ENCOUNTER — Other Ambulatory Visit (HOSPITAL_COMMUNITY): Payer: Self-pay | Admitting: Obstetrics and Gynecology

## 2020-04-09 DIAGNOSIS — Z3042 Encounter for surveillance of injectable contraceptive: Secondary | ICD-10-CM | POA: Diagnosis not present

## 2020-05-13 ENCOUNTER — Encounter: Payer: Self-pay | Admitting: Neurology

## 2020-05-13 ENCOUNTER — Other Ambulatory Visit: Payer: Self-pay

## 2020-05-13 ENCOUNTER — Ambulatory Visit (INDEPENDENT_AMBULATORY_CARE_PROVIDER_SITE_OTHER): Payer: 59 | Admitting: Neurology

## 2020-05-13 VITALS — BP 171/106 | HR 88 | Ht 61.0 in | Wt 228.5 lb

## 2020-05-13 DIAGNOSIS — R200 Anesthesia of skin: Secondary | ICD-10-CM

## 2020-05-13 DIAGNOSIS — Z79899 Other long term (current) drug therapy: Secondary | ICD-10-CM | POA: Insufficient documentation

## 2020-05-13 DIAGNOSIS — G2581 Restless legs syndrome: Secondary | ICD-10-CM

## 2020-05-13 DIAGNOSIS — G35 Multiple sclerosis: Secondary | ICD-10-CM | POA: Diagnosis not present

## 2020-05-13 DIAGNOSIS — Z8669 Personal history of other diseases of the nervous system and sense organs: Secondary | ICD-10-CM

## 2020-05-13 MED ORDER — TRAZODONE HCL 50 MG PO TABS: ORAL_TABLET | ORAL | 11 refills | Status: DC

## 2020-05-13 NOTE — Progress Notes (Signed)
[facility]  PATIENT: Haley Mitchell DOB: [birth year]  REFERRING DOCTOR OR PCP:  Catalina Pizza, FNP SOURCE: Patient, notes from Ms. Myrtie Soman and ED,  MRI report and MRI images on PACS.  _________________________________   HISTORICAL  CHIEF COMPLAINT:  Chief Complaint  Patient presents with  . Follow-up    RM 13, alone. Last seen 11/12/19 by AL,NP   . Multiple Sclerosis    On Rebif  . Spasms    Takes Baclofen    HISTORY OF PRESENT ILLNESS:  Haley Mitchell is a 39 year old woman who was diagnosed with MS in December 2018  Update 05/13/2020: She is on Rebif and she tolerates it well with no systemic or skin reactions.   Her last relapses since 08/2017, a few weeks after she started Rebif.     Her vision has returned to normal incuding colors.     She is walking well and she can do stairs wells.   She walked 2 1/2 miles straight recently.   Strength and sensation are doing ok.   She has less dysesthesias in her feet now and usually if some occurs it improves with moving around.   Bladder function is fine.  She has fatigue daily and this is her main problem currently   She is getting 4-5 hours sleep many nights due to sleep onset and maintenance insomnia.   She snores but has no definite OSA sign.     She takes armodafinil with benefit and she tolerates it well.         She has had her vaccinations and we discussed getting the booster at 6 months.   Since the last visit, she has had MRIs of the spine. It shows a definite plaque to the left at T1 and a more subtle focus to the right at C3-C4, both consistent with demyelinating lesions as would be seen with multiple sclerosis.  The focus at T1 to the left can explain the symptoms that occurred in her left leg 2 years ago. In retrospect she also recalls one year ago having some heaviness in the right arm for about a week. In October 2018 she had a definite exacerbation with numbness and Combined with her symptoms and her  abnormal brain MRI, we can be fairly certain of the diagnosis of multiple sclerosis and she meets McDonald criteria.   She is here today to discuss the possible DMTs.  We had a long discussion about treatment options for her newly diagnosed MS going over efficacy, safety and tolerability.. Her presentation is of medium aggressiveness and she would be a candidate for many different medications. Due to the long safety records of the interferons, she would prefer to start with Rebif. She will consider one of the oral agents or one of the IV medications if she has breakthrough activity.  MS history: Around 05/16/2017 she woke up with numbness and side of the face, tongue and lip. She presented to the emergency room 05/19/2017. There was no weakness but words were slightly slurred.    No numbness, weakness or clumsiness elsewhere.    An MRI of the brain, showed about a half dozen T2/FLAIR hyperintense foci, 3 of which were periventricular, radially oriented to the ventricles.   None of the foci enhanced after contrast.    Over the next week the numbness resolved.   In retrospect in 2016, while she was living in [place], she had the onset of numbness and stiffness with spasms in the left leg. At that  time, there were no symptoms in the right leg. Over a couple weeks the symptoms improved but she continues to get some intermittent numbness.  MRI of the cervical and thoracic spine on 07/10/2017 showed a plaque at T1 to the left and at C3-C4 to the right consistent with demyelinating lesions.  She was started on Rebif January 2019.  Imaging studies: MRI of the brain 05/19/2017 showed about a half dozen T2/FLAIR hyperintense foci, 3 of which were periventricular, radially oriented to the ventricles.   None of the foci enhanced after contrast  MRI of the cervical spine 07/10/2017 showed a plaque at T1 to the left and at C3-C4 to the right consistent with demyelinating lesions  MRI of the thoracic spine  07/10/2017 showed a plaque at T1 to the left but did not enhance. She denies any family history of multiple sclerosis. Her mother has psoriasis.   REVIEW OF SYSTEMS: Constitutional: No fevers, chills, sweats, or change in appetite.  She has insomnia and probable restless leg syndrome. Eyes: No visual changes, double vision, eye pain Ear, nose and throat: No hearing loss, ear pain, nasal congestion, sore throat Cardiovascular: No chest pain, palpitations Respiratory: No shortness of breath at rest or with exertion.   No wheezes GastrointestinaI: No nausea, vomiting, diarrhea, abdominal pain, fecal incontinence Genitourinary: No dysuria, urinary retention or frequency.  No nocturia. Musculoskeletal: No neck pain, back pain Integumentary: No rash, pruritus, skin lesions Neurological: as above Psychiatric: No depression at this time.  No anxiety Endocrine: No palpitations, diaphoresis, change in appetite, change in weigh or increased thirst Hematologic/Lymphatic: No anemia, purpura, petechiae. Allergic/Immunologic: No itchy/runny eyes, nasal congestion, recent allergic reactions, rashes  ALLERGIES: No Known Allergies  HOME MEDICATIONS:  Current Outpatient Medications:  .  baclofen (LIORESAL) 10 MG tablet, 1/2 to 1 pill po twice a day as needed, Disp: 60 each, Rfl: 5 .  gabapentin (NEURONTIN) 600 MG tablet, TAKE HALF TO 1 TABLET BY MOUTH AT BEDTIME, Disp: 90 tablet, Rfl: 5 .  hydrochlorothiazide (MICROZIDE) 12.5 MG capsule, Take 12.5 mg by mouth daily., Disp: , Rfl:  .  Interferon Beta-1a (REBIF REBIDOSE) 44 MCG/0.5ML SOAJ, Inject 44 mcg into the skin 3 (three) times a week., Disp: 6 mL, Rfl: 11 .  levocetirizine (XYZAL) 5 MG tablet, Take 1 tablet (5 mg total) by mouth every evening., Disp: 30 tablet, Rfl: 11 .  oxybutynin (DITROPAN-XL) 5 MG 24 hr tablet, Take 1 tablet (5 mg total) by mouth at bedtime., Disp: 30 tablet, Rfl: 11 .  traZODone (DESYREL) 50 MG tablet, Take one or two pills  po qHS, Disp: 60 tablet, Rfl: 11  PAST MEDICAL HISTORY: Past Medical History:  Diagnosis Date  . Headache   . Plantar fasciitis   . Vision abnormalities     PAST SURGICAL HISTORY: Past Surgical History:  Procedure Laterality Date  . c section    . GASTRIC BYPASS      FAMILY HISTORY: Family History  Problem Relation Age of Onset  . Hypertension Mother   . Heart disease Mother     SOCIAL HISTORY:  Social History   Socioeconomic History  . Marital status: Married    Spouse name: Not on file  . Number of children: 3  . Years of education: Not on file  . Highest education level: Not on file  Occupational History  . Not on file  Tobacco Use  . Smoking status: Never Smoker  . Smokeless tobacco: Never Used  Vaping Use  . Vaping Use:  Never used  Substance and Sexual Activity  . Alcohol use: Yes    Comment: social  . Drug use: No  . Sexual activity: Not on file  Other Topics Concern  . Not on file  Social History Narrative   Lives at home with her husband and children   Right handed   Caffeine: 1 coffee in AM   Social Determinants of Health   Financial Resource Strain:   . Difficulty of Paying Living Expenses: Not on file  Food Insecurity:   . Worried About Programme researcher, broadcasting/film/video in the Last Year: Not on file  . Ran Out of Food in the Last Year: Not on file  Transportation Needs:   . Lack of Transportation (Medical): Not on file  . Lack of Transportation (Non-Medical): Not on file  Physical Activity:   . Days of Exercise per Week: Not on file  . Minutes of Exercise per Session: Not on file  Stress:   . Feeling of Stress : Not on file  Social Connections:   . Frequency of Communication with Friends and Family: Not on file  . Frequency of Social Gatherings with Friends and Family: Not on file  . Attends Religious Services: Not on file  . Active Member of Clubs or Organizations: Not on file  . Attends Banker Meetings: Not on file  . Marital  Status: Not on file  Intimate Partner Violence:   . Fear of Current or Ex-Partner: Not on file  . Emotionally Abused: Not on file  . Physically Abused: Not on file  . Sexually Abused: Not on file     PHYSICAL EXAM  Vitals:   05/13/20 1524  BP: (!) 171/106  Pulse: 88  Weight: 228 lb 8 oz (103.6 kg)  Height: 5\' 1"  (1.549 m)    Body mass index is 43.17 kg/m.   General: The patient is well-developed and well-nourished and in no acute distress.  Pharynx is Mallampatti 1  Eyes:  Funduscopic exam shows normal optic discs and retinal vessels.   Neurologic Exam  Mental status: The patient is alert and oriented x 3 at the time of the examination. The patient has apparent normal recent and remote memory, with an apparently normal attention span and concentration ability.   Speech is normal.  Cranial nerves: Extraocular movements are full.  She has a right APD.  Color vision is now normal and symmetric.  Facial strength and sensation was normal.  Palatal elevation and tongue protrusion was midline.  No obvious hearing deficits are noted.  Motor:  Muscle bulk is normal.   Tone is normal. Strength is  5 / 5 in all 4 extremities.   Sensory: In the arms, she had symmetric sensation to touch, temperature and vibration.  In the legs,, she had intact and symmetric sensation to touch and temperature and vibration.  Coordination: Finger-nose-finger and heel-to-shin was performed well.  Gait and station: Station is normal.   Gait and tandem gait are normal.  Romberg is negative.  Reflexes: Deep tendon reflexes are symmetric and normal bilaterally.       DIAGNOSTIC DATA (LABS, IMAGING, TESTING) - I reviewed patient records, labs, notes, testing and imaging myself where available.  Lab Results  Component Value Date   WBC 3.6 11/12/2019   HGB 12.3 11/12/2019   HCT 36.8 11/12/2019   MCV 88 11/12/2019   PLT 257 11/12/2019      Component Value Date/Time   NA 138 11/12/2019 1447   K  3.8  11/12/2019 1447   CL 101 11/12/2019 1447   CO2 22 11/12/2019 1447   GLUCOSE 76 11/12/2019 1447   GLUCOSE 83 05/19/2017 0950   BUN 9 11/12/2019 1447   CREATININE 0.73 11/12/2019 1447   CALCIUM 9.0 11/12/2019 1447   PROT 7.2 11/12/2019 1447   ALBUMIN 4.3 11/12/2019 1447   AST 13 11/12/2019 1447   ALT 6 11/12/2019 1447   ALKPHOS 77 11/12/2019 1447   BILITOT 0.3 11/12/2019 1447   GFRNONAA 105 11/12/2019 1447   GFRAA 121 11/12/2019 1447       ASSESSMENT AND PLAN  Restless leg  Multiple sclerosis (HCC) - Plan: MR BRAIN W WO CONTRAST  High risk medication use  Left leg numbness  History of optic neuritis   1.      Continue Rebif.  We will check an MRI of the brain to determine if there is any subclinical progression..  If present we will need to consider a different disease modifying therapy.. Labs were fine at the last visit.  We can recheck at the next visit.   2.     Continue Nuvigil for fatigue and gabapentin for dysesthesias 3.   Return in 6 months or sooner if new or worsening neurologic symptoms.  Ferry Matthis A. Epimenio Foot, MD, [facility] 05/13/2020, 5:15 PM Certified in Neurology, Clinical Neurophysiology, Sleep Medicine, Pain Medicine and Neuroimaging  [facility] [address], [address] [place], [place] [address] [phone]

## 2020-05-14 ENCOUNTER — Telehealth: Payer: Self-pay | Admitting: Neurology

## 2020-05-14 NOTE — Telephone Encounter (Signed)
[facility] order sent to GI. No auth they will reach out to the patient to schedule.  

## 2020-05-30 ENCOUNTER — Ambulatory Visit
Admission: RE | Admit: 2020-05-30 | Discharge: 2020-05-30 | Disposition: A | Discharge status: Home / Self Care | Payer: 59 | Source: Ambulatory Visit | Attending: Neurology | Admitting: Neurology

## 2020-05-30 DIAGNOSIS — G35 Multiple sclerosis: Secondary | ICD-10-CM | POA: Diagnosis not present

## 2020-05-30 MED ORDER — GADOBENATE DIMEGLUMINE 529 MG/ML IV SOLN
20.0000 mL | Freq: Once | INTRAVENOUS | Status: AC | PRN
  Administered 2020-05-30: 20 mL via INTRAVENOUS

## 2020-06-26 ENCOUNTER — Other Ambulatory Visit: Payer: Self-pay | Admitting: *Deleted

## 2020-06-26 MED ORDER — OXYBUTYNIN CHLORIDE ER 5 MG PO TB24
5.0000 mg | ORAL_TABLET | Freq: Every day | ORAL | 11 refills | Status: DC
Start: 2020-06-26 — End: 2020-08-26

## 2020-06-27 DIAGNOSIS — Z3202 Encounter for pregnancy test, result negative: Secondary | ICD-10-CM | POA: Diagnosis not present

## 2020-06-27 DIAGNOSIS — Z3042 Encounter for surveillance of injectable contraceptive: Secondary | ICD-10-CM | POA: Diagnosis not present

## 2020-07-28 ENCOUNTER — Telehealth: Payer: Self-pay | Admitting: *Deleted

## 2020-07-28 NOTE — Telephone Encounter (Signed)
Faxed printed/signed refill for Rebif rebidose 21mcg/0.5ml (36 each), 3 reflils to [facility] at [phone]. Received fax confirmation.

## 2020-07-30 ENCOUNTER — Telehealth: Payer: Self-pay | Admitting: *Deleted

## 2020-07-30 NOTE — Telephone Encounter (Signed)
Submitted PA Rebif on CMM. OAC:[id number]. Waiting on determination from [employer].

## 2020-07-30 NOTE — Telephone Encounter (Signed)
LVM for pt to call office back. We need her to upload copy of new insurance cards (front/back) into her mychart account for the year for [place] to complete PA for her Rebif  We received PA request for her Rebif on CMM. Key: [id number]. Received the following response back when I tried submitting via [employer]: "Patient does not have active coverage with the payer"

## 2020-08-01 NOTE — Telephone Encounter (Signed)
[employer] requested additional information by fax and it was provided. Received an approval for Rebif . Valid from 08/01/20 through 07/31/21. PA Ref #[id number].

## 2020-08-08 ENCOUNTER — Ambulatory Visit (HOSPITAL_BASED_OUTPATIENT_CLINIC_OR_DEPARTMENT_OTHER): Payer: 59 | Admitting: Pharmacist

## 2020-08-08 ENCOUNTER — Other Ambulatory Visit: Payer: Self-pay

## 2020-08-08 ENCOUNTER — Telehealth: Payer: Self-pay | Admitting: Pharmacist

## 2020-08-08 DIAGNOSIS — Z7189 Other specified counseling: Secondary | ICD-10-CM

## 2020-08-08 NOTE — Telephone Encounter (Signed)
Called patient to schedule an appointment for the [employer] [facility]. I was unable to reach the patient so I left a HIPAA-compliant message requesting that the patient return my call.   

## 2020-08-08 NOTE — Progress Notes (Signed)
S: Patient presents for review of their specialty medication therapy.  Patient is currently taking Rebif for multiple sclerosis (MS). Patient is managed by Despina Arias for this.   Adherence: confirms  Efficacy: reports that it it working well for her  Dosing: Rebif (subQ): 44 mcg 3 times weekly   Dose adjustments: Renal: no dose adjustments  Hepatic: There are no dosage adjustment provided in the manufacturer's labeling; use with caution in patients with active liver disease, alcohol abuse, ALT >2.5 x ULN, or a history of significant liver disease. Rebif [place] labeling contraindicates use in decompensated liver disease.  Dose adjustments for toxicities: Autoimmune disorder development: Consider discontinuing treatment. Depression or other severe psychiatric symptoms: Consider discontinuing treatment. Hepatotoxicity: ALT >5 x ULN: Temporarily discontinue therapy or consider dose reduction until ALT normalizes, then may consider retitration of dose. Symptomatic (eg, jaundice): Discontinue immediately. Leukopenia: May require temporary discontinuation or dose reduction until resolution  Drug-drug interactions: none identified  Monitoring: Blood work monitored. Last done in April of 2021.  CBC: WNL, per pt LFTs: WNL, per pt Thyroid function tests: WNL, per pt Injection site reactions: denies  S/sx of malignancy: denies  Neuropsychiatric symptoms: denies  Thrombotic microangiopathy (monitor for new onset HTN, thrombocytopenia, or impaired renal dysfunction): denies symptoms; blood work WNL, per pt  O:  Lab Results  Component Value Date   WBC 3.6 11/12/2019   HGB 12.3 11/12/2019   HCT 36.8 11/12/2019   MCV 88 11/12/2019   PLT 257 11/12/2019      Chemistry      Component Value Date/Time   NA 138 11/12/2019 1447   K 3.8 11/12/2019 1447   CL 101 11/12/2019 1447   CO2 22 11/12/2019 1447   BUN 9 11/12/2019 1447   CREATININE 0.73 11/12/2019 1447      Component Value  Date/Time   CALCIUM 9.0 11/12/2019 1447   ALKPHOS 77 11/12/2019 1447   AST 13 11/12/2019 1447   ALT 6 11/12/2019 1447   BILITOT 0.3 11/12/2019 1447     No results found for: TSH  A/P: 1. Medication review: Patient currently on Rebif for the treatment of MS and is tolerating it well. Reviewed the medication with the patient, including the following: Rebif, interferon beta-1a, is an interferon indicated for the treatment of MS. Patient educated on purpose, proper use and potential adverse effects of Rebif. Analgesics and/or antipyretics may help decrease flu-like symptoms on treatment days. Possible adverse effects include changes to thyroid or liver, injection site reactions, increased risk of malignancy, and neuropsychiatric symptoms.  Administer SubQ at the same time of day on the same 3 days each week (eg, Mon, Wed, Fri), preferably in the late afternoon or evening; doses should be at least 48 hours apart; rotate injection site; do not inject into area where skin is irritated, red, bruised, or scarred. Discard any unused portion. No recommendations for any changes.  Butch Penny, PharmD, [place]y [place], CPP Clinical Pharmacist [facility] [phone]

## 2020-08-15 ENCOUNTER — Encounter: Payer: Self-pay | Admitting: Family Medicine

## 2020-08-25 ENCOUNTER — Encounter: Payer: Self-pay | Admitting: Family Medicine

## 2020-08-26 ENCOUNTER — Other Ambulatory Visit: Payer: Self-pay | Admitting: Family Medicine

## 2020-08-26 MED ORDER — OXYBUTYNIN CHLORIDE ER 5 MG PO TB24: 10.0000 mg | ORAL_TABLET | Freq: Every day | ORAL | 3 refills | Status: DC

## 2020-08-27 ENCOUNTER — Other Ambulatory Visit: Payer: Self-pay | Admitting: *Deleted

## 2020-08-27 ENCOUNTER — Other Ambulatory Visit: Payer: Self-pay | Admitting: Pharmacist

## 2020-08-27 DIAGNOSIS — G35 Multiple sclerosis: Secondary | ICD-10-CM

## 2020-08-27 MED ORDER — REBIF REBIDOSE 44 MCG/0.5ML ~~LOC~~ SOAJ: 44.0000 ug | SUBCUTANEOUS | 11 refills | Status: DC

## 2020-10-17 ENCOUNTER — Other Ambulatory Visit (HOSPITAL_COMMUNITY): Payer: Self-pay

## 2020-11-11 ENCOUNTER — Encounter: Payer: Self-pay | Admitting: Family Medicine

## 2020-11-11 ENCOUNTER — Ambulatory Visit: Payer: Self-pay | Admitting: Family Medicine

## 2020-11-12 ENCOUNTER — Other Ambulatory Visit (HOSPITAL_COMMUNITY): Payer: Self-pay

## 2020-11-12 MED FILL — Interferon Beta-1a Soln Auto-inj 44 MCG/0.5ML: SUBCUTANEOUS | 28 days supply | Qty: 6 | Fill #0 | Status: AC

## 2020-11-14 ENCOUNTER — Other Ambulatory Visit: Payer: Self-pay | Admitting: Neurology

## 2020-11-17 ENCOUNTER — Other Ambulatory Visit (HOSPITAL_COMMUNITY): Payer: Self-pay

## 2020-11-26 ENCOUNTER — Other Ambulatory Visit (HOSPITAL_COMMUNITY): Payer: Self-pay

## 2020-11-26 ENCOUNTER — Other Ambulatory Visit: Payer: Self-pay | Admitting: *Deleted

## 2020-11-26 MED ORDER — GABAPENTIN 600 MG PO TABS
ORAL_TABLET | ORAL | 1 refills | Status: DC
Start: 2020-11-26 — End: 2024-05-24

## 2020-11-26 MED FILL — Oxybutynin Chloride Tab ER 24HR 5 MG: ORAL | 90 days supply | Qty: 180 | Fill #0 | Status: AC

## 2020-11-27 ENCOUNTER — Other Ambulatory Visit (HOSPITAL_COMMUNITY): Payer: Self-pay

## 2020-12-11 ENCOUNTER — Other Ambulatory Visit (HOSPITAL_COMMUNITY): Payer: Self-pay

## 2020-12-15 ENCOUNTER — Other Ambulatory Visit (HOSPITAL_COMMUNITY): Payer: Self-pay

## 2020-12-15 MED FILL — Interferon Beta-1a Soln Auto-inj 44 MCG/0.5ML: SUBCUTANEOUS | 28 days supply | Qty: 6 | Fill #1 | Status: AC

## 2021-01-01 ENCOUNTER — Other Ambulatory Visit (HOSPITAL_COMMUNITY): Payer: Self-pay

## 2021-01-01 DIAGNOSIS — R519 Headache, unspecified: Secondary | ICD-10-CM | POA: Insufficient documentation

## 2021-01-01 DIAGNOSIS — N3281 Overactive bladder: Secondary | ICD-10-CM | POA: Insufficient documentation

## 2021-01-01 DIAGNOSIS — I10 Essential (primary) hypertension: Secondary | ICD-10-CM | POA: Insufficient documentation

## 2021-01-01 MED ORDER — NITROFURANTOIN MONOHYD MACRO 100 MG PO CAPS
ORAL_CAPSULE | ORAL | 0 refills | Status: DC
Start: 2021-01-01 — End: 2021-12-25
  Filled 2021-01-01: qty 10, 5d supply, fill #0

## 2021-01-02 ENCOUNTER — Other Ambulatory Visit (HOSPITAL_COMMUNITY): Payer: Self-pay

## 2021-01-06 ENCOUNTER — Other Ambulatory Visit (HOSPITAL_COMMUNITY): Payer: Self-pay

## 2021-01-08 ENCOUNTER — Other Ambulatory Visit (HOSPITAL_COMMUNITY): Payer: Self-pay

## 2021-01-09 ENCOUNTER — Other Ambulatory Visit (HOSPITAL_COMMUNITY): Payer: Self-pay

## 2021-01-12 ENCOUNTER — Other Ambulatory Visit (HOSPITAL_COMMUNITY): Payer: Self-pay

## 2021-01-12 MED FILL — Interferon Beta-1a Soln Auto-inj 44 MCG/0.5ML: SUBCUTANEOUS | 28 days supply | Qty: 6 | Fill #2 | Status: AC

## 2021-01-13 ENCOUNTER — Other Ambulatory Visit (HOSPITAL_COMMUNITY): Payer: Self-pay

## 2021-01-22 ENCOUNTER — Other Ambulatory Visit (HOSPITAL_BASED_OUTPATIENT_CLINIC_OR_DEPARTMENT_OTHER): Payer: Self-pay

## 2021-02-03 ENCOUNTER — Other Ambulatory Visit (HOSPITAL_COMMUNITY): Payer: Self-pay

## 2021-02-06 ENCOUNTER — Other Ambulatory Visit (HOSPITAL_COMMUNITY): Payer: Self-pay

## 2021-02-10 ENCOUNTER — Other Ambulatory Visit (HOSPITAL_COMMUNITY): Payer: Self-pay

## 2021-02-10 MED FILL — Interferon Beta-1a Soln Auto-inj 44 MCG/0.5ML: SUBCUTANEOUS | 28 days supply | Qty: 6 | Fill #3 | Status: AC

## 2021-02-12 ENCOUNTER — Other Ambulatory Visit (HOSPITAL_COMMUNITY): Payer: Self-pay

## 2021-03-06 ENCOUNTER — Other Ambulatory Visit (HOSPITAL_COMMUNITY): Payer: Self-pay

## 2021-03-06 MED ORDER — HYDROCODONE-ACETAMINOPHEN 5-325 MG PO TABS
ORAL_TABLET | ORAL | 0 refills | Status: DC
  Filled 2021-03-06: qty 8, 2d supply, fill #0

## 2021-03-13 ENCOUNTER — Other Ambulatory Visit (HOSPITAL_COMMUNITY): Payer: Self-pay

## 2021-04-06 ENCOUNTER — Other Ambulatory Visit (HOSPITAL_COMMUNITY): Payer: Self-pay

## 2021-04-06 MED FILL — Interferon Beta-1a Soln Auto-inj 44 MCG/0.5ML: SUBCUTANEOUS | 28 days supply | Qty: 6 | Fill #4 | Status: AC

## 2021-04-07 ENCOUNTER — Other Ambulatory Visit (HOSPITAL_COMMUNITY): Payer: Self-pay

## 2021-04-29 ENCOUNTER — Other Ambulatory Visit (HOSPITAL_COMMUNITY): Payer: Self-pay

## 2021-04-29 MED FILL — Interferon Beta-1a Soln Auto-inj 44 MCG/0.5ML: SUBCUTANEOUS | 28 days supply | Qty: 6 | Fill #5 | Status: AC

## 2021-05-04 ENCOUNTER — Other Ambulatory Visit (HOSPITAL_COMMUNITY): Payer: Self-pay

## 2021-05-27 ENCOUNTER — Other Ambulatory Visit (HOSPITAL_COMMUNITY): Payer: Self-pay

## 2021-05-29 ENCOUNTER — Other Ambulatory Visit (HOSPITAL_COMMUNITY): Payer: Self-pay

## 2021-06-01 ENCOUNTER — Other Ambulatory Visit (HOSPITAL_COMMUNITY): Payer: Self-pay

## 2021-06-05 ENCOUNTER — Other Ambulatory Visit (HOSPITAL_COMMUNITY): Payer: Self-pay

## 2021-06-05 MED FILL — Oxybutynin Chloride Tab ER 24HR 5 MG: ORAL | 90 days supply | Qty: 180 | Fill #1 | Status: AC

## 2021-06-05 MED FILL — Interferon Beta-1a Soln Auto-inj 44 MCG/0.5ML: SUBCUTANEOUS | 28 days supply | Qty: 6 | Fill #6 | Status: AC

## 2021-06-09 ENCOUNTER — Other Ambulatory Visit (HOSPITAL_COMMUNITY): Payer: Self-pay

## 2021-07-02 ENCOUNTER — Other Ambulatory Visit (HOSPITAL_COMMUNITY): Payer: Self-pay

## 2021-07-29 ENCOUNTER — Other Ambulatory Visit (HOSPITAL_COMMUNITY): Payer: Self-pay

## 2021-07-29 MED FILL — Interferon Beta-1a Soln Auto-inj 44 MCG/0.5ML: SUBCUTANEOUS | 28 days supply | Qty: 6 | Fill #7 | Status: CN

## 2021-07-30 ENCOUNTER — Other Ambulatory Visit (HOSPITAL_COMMUNITY): Payer: Self-pay

## 2021-08-05 ENCOUNTER — Other Ambulatory Visit (HOSPITAL_COMMUNITY): Payer: Self-pay

## 2021-08-07 ENCOUNTER — Other Ambulatory Visit (HOSPITAL_COMMUNITY): Payer: Self-pay

## 2021-08-07 MED FILL — Interferon Beta-1a Soln Auto-inj 44 MCG/0.5ML: SUBCUTANEOUS | 28 days supply | Qty: 6 | Fill #7 | Status: AC

## 2021-08-10 ENCOUNTER — Other Ambulatory Visit (HOSPITAL_COMMUNITY): Payer: Self-pay

## 2021-08-11 ENCOUNTER — Telehealth: Payer: Self-pay | Admitting: Pharmacist

## 2021-08-11 NOTE — Telephone Encounter (Signed)
Called patient to schedule an appointment for the [facility]. I was unable to reach the patient so I left a HIPAA-compliant message requesting that the patient return my call.   Luke Van Ausdall, PharmD, BCACP, CPP Clinical Pharmacist [facility] [phone]  

## 2021-08-26 ENCOUNTER — Ambulatory Visit: Payer: No Typology Code available for payment source | Attending: Family Medicine | Admitting: Pharmacist

## 2021-08-26 DIAGNOSIS — Z7189 Other specified counseling: Secondary | ICD-10-CM

## 2021-08-26 NOTE — Progress Notes (Signed)
S: Patient presents for review of their specialty medication therapy.  Patient is currently taking Rebif for multiple sclerosis (MS). Patient is managed by Arlice Colt for this.   Adherence: confirms  Efficacy: reports that it it working well for her  Dosing: Rebif (subQ): 44 mcg 3 times weekly   Dose adjustments: Renal: no dose adjustments  Hepatic: There are no dosage adjustment provided in the manufacturers labeling; use with caution in patients with active liver disease, alcohol abuse, ALT >2.5 x ULN, or a history of significant liver disease. Rebif [place] labeling contraindicates use in decompensated liver disease.  Dose adjustments for toxicities: Autoimmune disorder development: Consider discontinuing treatment. Depression or other severe psychiatric symptoms: Consider discontinuing treatment. Hepatotoxicity: ALT >5 x ULN: Temporarily discontinue therapy or consider dose reduction until ALT normalizes, then may consider retitration of dose. Symptomatic (eg, jaundice): Discontinue immediately. Leukopenia: May require temporary discontinuation or dose reduction until resolution  Drug-drug interactions: none identified  Monitoring: Blood work monitored. Last done in April of 2021.  CBC: WNL, per pt LFTs: WNL, per pt Thyroid function tests: WNL, per pt Injection site reactions: denies  S/sx of malignancy: denies  Neuropsychiatric symptoms: denies  Thrombotic microangiopathy (monitor for new onset HTN, thrombocytopenia, or impaired renal dysfunction): denies symptoms; blood work WNL, per pt  O:  Lab Results  Component Value Date   WBC 3.6 11/12/2019   HGB 12.3 11/12/2019   HCT 36.8 11/12/2019   MCV 88 11/12/2019   PLT 257 11/12/2019      Chemistry      Component Value Date/Time   NA 138 11/12/2019 1447   K 3.8 11/12/2019 1447   CL 101 11/12/2019 1447   CO2 22 11/12/2019 1447   BUN 9 11/12/2019 1447   CREATININE 0.73 11/12/2019 1447      Component Value  Date/Time   CALCIUM 9.0 11/12/2019 1447   ALKPHOS 77 11/12/2019 1447   AST 13 11/12/2019 1447   ALT 6 11/12/2019 1447   BILITOT 0.3 11/12/2019 1447     No results found for: TSH  A/P: 1. Medication review: Patient currently on Rebif for the treatment of MS and is tolerating it well. Reviewed the medication with the patient, including the following: Rebif, interferon beta-1a, is an interferon indicated for the treatment of MS. Patient educated on purpose, proper use and potential adverse effects of Rebif. Analgesics and/or antipyretics may help decrease flu-like symptoms on treatment days. Possible adverse effects include changes to thyroid or liver, injection site reactions, increased risk of malignancy, and neuropsychiatric symptoms.  Administer SubQ at the same time of day on the same 3 days each week (eg, Mon, Wed, Fri), preferably in the late afternoon or evening; doses should be at least 48 hours apart; rotate injection site; do not inject into area where skin is irritated, red, bruised, or scarred. Discard any unused portion. No recommendations for any changes.  Benard Halsted, PharmD, [place], [place] [phone]

## 2021-08-31 ENCOUNTER — Other Ambulatory Visit (HOSPITAL_COMMUNITY): Payer: Self-pay

## 2021-09-01 ENCOUNTER — Other Ambulatory Visit: Payer: Self-pay | Admitting: Internal Medicine

## 2021-09-01 ENCOUNTER — Other Ambulatory Visit (HOSPITAL_COMMUNITY): Payer: Self-pay

## 2021-09-01 ENCOUNTER — Other Ambulatory Visit: Payer: Self-pay

## 2021-09-01 DIAGNOSIS — G35 Multiple sclerosis: Secondary | ICD-10-CM

## 2021-09-02 ENCOUNTER — Other Ambulatory Visit (HOSPITAL_COMMUNITY): Payer: Self-pay

## 2021-09-02 ENCOUNTER — Other Ambulatory Visit: Payer: Self-pay | Admitting: Neurology

## 2021-09-02 DIAGNOSIS — G35 Multiple sclerosis: Secondary | ICD-10-CM

## 2021-09-02 NOTE — Telephone Encounter (Signed)
Not a provider in this practice. °

## 2021-09-03 ENCOUNTER — Other Ambulatory Visit (HOSPITAL_COMMUNITY): Payer: Self-pay

## 2021-09-04 ENCOUNTER — Other Ambulatory Visit (HOSPITAL_COMMUNITY): Payer: Self-pay

## 2021-09-07 ENCOUNTER — Encounter: Payer: No Typology Code available for payment source | Admitting: Family Medicine

## 2021-09-16 ENCOUNTER — Other Ambulatory Visit (HOSPITAL_COMMUNITY): Payer: Self-pay

## 2021-09-17 ENCOUNTER — Other Ambulatory Visit (HOSPITAL_COMMUNITY): Payer: Self-pay

## 2021-09-18 ENCOUNTER — Other Ambulatory Visit (HOSPITAL_COMMUNITY): Payer: Self-pay

## 2021-09-18 MED ORDER — GABAPENTIN 300 MG PO CAPS
ORAL_CAPSULE | ORAL | 0 refills | Status: DC
  Filled 2021-09-18: qty 180, 90d supply, fill #0

## 2021-09-25 ENCOUNTER — Other Ambulatory Visit (HOSPITAL_COMMUNITY): Payer: Self-pay

## 2021-09-28 ENCOUNTER — Other Ambulatory Visit (HOSPITAL_COMMUNITY): Payer: Self-pay

## 2021-10-01 ENCOUNTER — Other Ambulatory Visit (HOSPITAL_COMMUNITY): Payer: Self-pay

## 2021-10-13 ENCOUNTER — Ambulatory Visit: Payer: No Typology Code available for payment source | Admitting: Nurse Practitioner

## 2021-10-20 ENCOUNTER — Other Ambulatory Visit (HOSPITAL_COMMUNITY): Payer: Self-pay

## 2021-11-16 NOTE — Progress Notes (Deleted)
ANNUAL EXAM Patient name: Haley Mitchell MRN [id number]  Date of birth: 1980-10-08 Chief Complaint:   No chief complaint on file.  History of Present Illness:   Haley Mitchell is a 41 y.o. No obstetric history on file. female being seen today for a routine annual exam.   Current complaints: breast tenderness (bilateral). Last mxr was 2018. She also had an [place] at that time which was normal.   No LMP recorded.   The pregnancy intention screening data noted above was reviewed. Potential methods of contraception were discussed. The patient elected to proceed with No data recorded.   Last pap none on file. H/O abnormal pap: {yes/yes***/no:23866} Health Maintenance Due  Topic Date Due   HIV Screening  Never done   Hepatitis C Screening  Never done   TETANUS/TDAP  Never done   PAP SMEAR-Modifier  Never done   COVID-19 Vaccine (3 - Pfizer risk series) 11/23/2019         View : No data to display.               View : No data to display.           Review of Systems:   Pertinent items are noted in HPI Denies any headaches, blurred vision, fatigue, shortness of breath, chest pain, abdominal pain, abnormal vaginal discharge/itching/odor/irritation, problems with periods, bowel movements, urination, or intercourse unless otherwise stated above. *** Pertinent History Reviewed:  Reviewed past medical,surgical, social and family history.  Reviewed problem list, medications and allergies. Physical Assessment:  There were no vitals filed for this visit.There is no height or weight on file to calculate BMI.   Physical Examination:  General appearance - well appearing, and in no distress Mental status - alert, oriented to person, place, and time Psych:  She has a normal mood and affect Skin - warm and dry, normal color, no suspicious lesions noted Chest - effort normal, all lung fields clear to auscultation bilaterally Heart - normal rate and regular rhythm Neck:  midline  trachea, no thyromegaly or nodules Breasts - breasts appear normal, no suspicious masses, no skin or nipple changes or axillary nodes Abdomen - soft, nontender, nondistended, no masses or organomegaly Pelvic -  VULVA: normal appearing vulva with no masses, tenderness or lesions   VAGINA: normal appearing vagina with normal color and discharge, no lesions   CERVIX: normal appearing cervix without discharge or lesions, no CMT UTERUS: uterus is felt to be normal size, shape, consistency and nontender  ADNEXA: No adnexal masses or tenderness noted. Extremities:  No swelling or varicosities noted  Chaperone present for exam  No results found for this or any previous visit (from the past 24 hour(s)).  Assessment & Plan:  Diagnoses and all orders for this visit:  Encounter for annual routine gynecological examination  Breast tenderness in female   - Cervical cancer screening: Discussed guidelines. Pap with HPV done  - Gardasil: {Blank single:19197::"completed","has not yet had. Counseling provided and she declines","Has not yet had. Counseling provided and pt accepts"} - STD Testing: {Blank single:19197::"accepts","declines","not indicated"} - Birth Control: Discussed options and their risks, benefits and common side effects; discussed VTE with estrogen containing options. Desires: {Birth control type:23956} - Breast Health: Encouraged self breast awareness/SBE. Teaching provided. Discussed limits of clinical breast exam for detecting breast cancer. Rx given for MXR - F/U 12 months and prn    No orders of the defined types were placed in this encounter.   Meds: No orders of the defined  types were placed in this encounter.   Follow-up: No follow-ups on file.  Milas Hock, MD 11/16/2021 7:40 PM

## 2021-11-20 ENCOUNTER — Encounter: Payer: Self-pay | Admitting: Obstetrics and Gynecology

## 2021-11-20 DIAGNOSIS — N644 Mastodynia: Secondary | ICD-10-CM

## 2021-11-20 DIAGNOSIS — Z01419 Encounter for gynecological examination (general) (routine) without abnormal findings: Secondary | ICD-10-CM

## 2021-12-25 ENCOUNTER — Encounter: Payer: Self-pay | Admitting: Nurse Practitioner

## 2021-12-25 ENCOUNTER — Other Ambulatory Visit (HOSPITAL_COMMUNITY): Payer: Self-pay

## 2021-12-25 ENCOUNTER — Ambulatory Visit (INDEPENDENT_AMBULATORY_CARE_PROVIDER_SITE_OTHER): Payer: Commercial Managed Care - HMO | Admitting: Nurse Practitioner

## 2021-12-25 ENCOUNTER — Other Ambulatory Visit (HOSPITAL_COMMUNITY)
Admission: RE | Admit: 2021-12-25 | Discharge: 2021-12-25 | Disposition: A | Discharge status: Home / Self Care | Payer: No Typology Code available for payment source | Source: Ambulatory Visit | Attending: Nurse Practitioner | Admitting: Nurse Practitioner

## 2021-12-25 VITALS — BP 124/80 | Ht 63.0 in | Wt 250.0 lb

## 2021-12-25 DIAGNOSIS — Z01419 Encounter for gynecological examination (general) (routine) without abnormal findings: Secondary | ICD-10-CM

## 2021-12-25 DIAGNOSIS — N920 Excessive and frequent menstruation with regular cycle: Secondary | ICD-10-CM | POA: Diagnosis not present

## 2021-12-25 DIAGNOSIS — N3281 Overactive bladder: Secondary | ICD-10-CM

## 2021-12-25 MED ORDER — MEDROXYPROGESTERONE ACETATE 150 MG/ML IM SUSP
150.0000 mg | Freq: Once | INTRAMUSCULAR | Status: AC
  Administered 2021-12-25: 150 mg via INTRAMUSCULAR

## 2021-12-25 MED ORDER — OXYBUTYNIN CHLORIDE 5 MG PO TABS
10.0000 mg | ORAL_TABLET | Freq: Three times a day (TID) | ORAL | 0 refills | Status: DC
  Filled 2021-12-25: qty 180, 30d supply, fill #0

## 2021-12-25 MED ORDER — MEDROXYPROGESTERONE ACETATE 150 MG/ML IM SUSP
150.0000 mg | INTRAMUSCULAR | 2 refills | Status: DC
  Filled 2021-12-25: qty 1, 90d supply, fill #0
  Filled 2022-05-24: qty 1, 90d supply, fill #1
  Filled 2022-08-16: qty 1, 90d supply, fill #2

## 2021-12-25 NOTE — Addendum Note (Signed)
Addended byWyline Beady on: 12/25/2021 01:41 PM   Modules accepted: Orders

## 2021-12-25 NOTE — Progress Notes (Addendum)
Haley Mitchell May 25, 1981 [id number]   History:  41 y.o. Haley Mitchell presents for annual exam. Monthly cycles, heavy bleeding with clots first 4-5 days with total bleeding time of 10 days. This change occurred 2-3 years ago, had normal ultrasound at that time. Was on Depo in the past and would like to restart. Reports abnormal pap in the past that did not require intervention, never HPV positive. MS managed by neurology. Has OAB from MS medication and has taken Oxybutinin 10 mg daily with good management in the past and would like to restart this. She is up multiple times per night urinating.   Gynecologic History Patient's last menstrual period was 12/05/2021. Period Cycle (Days): 28 Period Duration (Days): 10 Period Pattern: Regular Menstrual Flow: Heavy Dysmenorrhea: (!) Mild Dysmenorrhea Symptoms: Cramping Contraception/Family planning: tubal ligation Sexually active: Yes  Health Maintenance Last Pap: 2019. Results were: Normal Last mammogram: 2018. Results were: Normal Last colonoscopy: Not indicated Last Dexa: Not indicated  Past medical history, past surgical history, family history and social history were all reviewed and documented in the EPIC chart. Married. 40 yo son, 47 and 77 yo daughters. 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   ROS:  A ROS was performed and pertinent positives and negatives are included.  Exam:  Vitals:   12/25/21 0821  BP: 124/80  Weight: 250 lb (113.4 kg)  Height: 5\' 3"  (1.6 m)   Body mass index is 44.29 kg/m.  General appearance:  Normal Thyroid:  Symmetrical, normal in size, without palpable masses or nodularity. Respiratory  Auscultation:  Clear without wheezing or rhonchi Cardiovascular  Auscultation:  Regular rate, without rubs, murmurs or gallops  Edema/varicosities:  Not grossly evident Abdominal  Soft,nontender, without masses, guarding or rebound.  Liver/spleen:  No organomegaly noted  Hernia:  None appreciated   Skin  Inspection:  Grossly normal Breasts: Examined lying and sitting.   Right: Without masses, retractions, nipple discharge or axillary adenopathy.   Left: Without masses, retractions, nipple discharge or axillary adenopathy. Genitourinary   Inguinal/mons:  Normal without inguinal adenopathy  External genitalia:  Normal appearing vulva with no masses, tenderness, or lesions  BUS/Urethra/Skene's glands:  Normal  Vagina:  Normal appearing with normal color and discharge, no lesions  Cervix:  Normal appearing without discharge or lesions  Uterus:  Difficult to palpate due to body habitus but no gross masses or tenderness  Adnexa/parametria:     Rt: Normal in size, without masses or tenderness.   Lt: Normal in size, without masses or tenderness.  Anus and perineum: Normal  Digital rectal exam: Not indicated  Patient informed chaperone available to be present for breast and pelvic exam. Patient has requested no chaperone to be present. Patient has been advised what will be completed during breast and pelvic exam.   Assessment/Plan:  41 y.o. G3P3 for annual exam.   Well female exam with routine gynecological exam - Plan: Cytology - PAP( [facility]). Education provided on SBEs, importance of preventative screenings, current guidelines, high calcium diet, regular exercise, and multivitamin daily.  Labs with PCP.   Menorrhagia with regular cycle - Plan: medroxyPROGESTERone (DEPO-PROVERA) injection 150 mg every 90 days. Monthly cycles, heavy bleeding with clots first 4-5 days with total bleeding time of 10 days. This change occurred 2-3 years ago, had normal ultrasound at that time. Was on Depo in the past and would like to restart. She would like to self-administer.   Overactive bladder - Plan: oxybutynin (DITROPAN) 5 MG tablet (2 tablets) daily.  Her MS medications cause this and she is up 3-4 times per night urinating. She did well on this in the past. Plans to find new neurology provider but  asking for refill until then.   Screening for cervical cancer - Abnormal pap years ago after oldest daughter was born, no intervention required. Pap today.   Screening for breast cancer - Discussed current guidelines and importance of preventative screenings. Information provided on [facility] and encourage to schedule mammogram soon. Normal breast exam today.  Return in 1 year for annual.     Olivia Mackie DNP, 8:56 AM 12/25/2021

## 2021-12-28 ENCOUNTER — Other Ambulatory Visit (HOSPITAL_COMMUNITY): Payer: Self-pay

## 2021-12-28 LAB — CYTOLOGY - PAP
Adequacy: ABSENT
Comment: NEGATIVE
Diagnosis: NEGATIVE
High risk HPV: NEGATIVE

## 2022-01-06 ENCOUNTER — Other Ambulatory Visit (HOSPITAL_COMMUNITY): Payer: Self-pay

## 2022-01-12 ENCOUNTER — Other Ambulatory Visit: Payer: Self-pay | Admitting: Nurse Practitioner

## 2022-01-12 DIAGNOSIS — Z1231 Encounter for screening mammogram for malignant neoplasm of breast: Secondary | ICD-10-CM

## 2022-01-19 ENCOUNTER — Other Ambulatory Visit (HOSPITAL_COMMUNITY): Payer: Self-pay

## 2022-01-22 ENCOUNTER — Ambulatory Visit
Admission: RE | Admit: 2022-01-22 | Discharge: 2022-01-22 | Disposition: A | Discharge status: Home / Self Care | Payer: No Typology Code available for payment source | Source: Ambulatory Visit | Attending: Nurse Practitioner | Admitting: Nurse Practitioner

## 2022-01-22 DIAGNOSIS — Z1231 Encounter for screening mammogram for malignant neoplasm of breast: Secondary | ICD-10-CM

## 2022-01-28 ENCOUNTER — Ambulatory Visit: Payer: Commercial Managed Care - HMO | Admitting: Family

## 2022-01-28 ENCOUNTER — Ambulatory Visit: Payer: Self-pay | Admitting: Family

## 2022-02-19 ENCOUNTER — Encounter: Payer: PRIVATE HEALTH INSURANCE | Admitting: Family

## 2022-03-01 NOTE — Progress Notes (Signed)
  This encounter was created in error - please disregard. No show 

## 2022-03-16 ENCOUNTER — Ambulatory Visit (INDEPENDENT_AMBULATORY_CARE_PROVIDER_SITE_OTHER): Payer: Commercial Managed Care - HMO

## 2022-03-16 DIAGNOSIS — N920 Excessive and frequent menstruation with regular cycle: Secondary | ICD-10-CM

## 2022-03-16 MED ORDER — MEDROXYPROGESTERONE ACETATE 150 MG/ML IM SUSP
150.0000 mg | Freq: Once | INTRAMUSCULAR | Status: AC
  Administered 2022-03-16: 150 mg via INTRAMUSCULAR

## 2022-03-16 NOTE — Progress Notes (Signed)
Patient here for Depo Provera injection.  LMP: 03/14/22.  Patient supplied medication.   Depo Provera 150mg  given IM RUOQ.  Patient tolerated injection well.  Next Injection is due 06/01/22-06/15/22.

## 2022-04-02 ENCOUNTER — Other Ambulatory Visit: Payer: Self-pay | Admitting: Nurse Practitioner

## 2022-04-02 DIAGNOSIS — N3281 Overactive bladder: Secondary | ICD-10-CM

## 2022-04-02 NOTE — Telephone Encounter (Signed)
Last annual exam was 12/2021 

## 2022-04-05 ENCOUNTER — Other Ambulatory Visit (HOSPITAL_COMMUNITY): Payer: Self-pay

## 2022-04-05 MED ORDER — OXYBUTYNIN CHLORIDE 5 MG PO TABS
10.0000 mg | ORAL_TABLET | Freq: Every evening | ORAL | 0 refills | Status: DC
  Filled 2022-04-05: qty 90, 45d supply, fill #0

## 2022-05-24 ENCOUNTER — Other Ambulatory Visit: Payer: Self-pay | Admitting: Nurse Practitioner

## 2022-05-24 ENCOUNTER — Other Ambulatory Visit (HOSPITAL_COMMUNITY): Payer: Self-pay

## 2022-05-24 DIAGNOSIS — N3281 Overactive bladder: Secondary | ICD-10-CM

## 2022-05-25 ENCOUNTER — Other Ambulatory Visit (HOSPITAL_COMMUNITY): Payer: Self-pay

## 2022-05-25 MED ORDER — OXYBUTYNIN CHLORIDE 5 MG PO TABS
10.0000 mg | ORAL_TABLET | Freq: Every evening | ORAL | 1 refills | Status: DC
  Filled 2022-05-25: qty 90, 45d supply, fill #0

## 2022-05-25 NOTE — Telephone Encounter (Signed)
Medication refill request: ditropan  Last AEX:  12/25/21 Next AEX: none scheduled  Last MMG (if hormonal medication request): n/a Refill authorized: #[id number] with 3 rf pended

## 2022-05-27 ENCOUNTER — Encounter (HOSPITAL_COMMUNITY): Payer: Self-pay

## 2022-05-27 ENCOUNTER — Other Ambulatory Visit (HOSPITAL_COMMUNITY): Payer: Self-pay

## 2022-05-31 ENCOUNTER — Other Ambulatory Visit: Payer: Self-pay | Admitting: Adult Health

## 2022-05-31 DIAGNOSIS — N644 Mastodynia: Secondary | ICD-10-CM

## 2022-05-31 DIAGNOSIS — N6320 Unspecified lump in the left breast, unspecified quadrant: Secondary | ICD-10-CM

## 2022-06-01 ENCOUNTER — Ambulatory Visit: Payer: Commercial Managed Care - HMO

## 2022-06-14 ENCOUNTER — Encounter: Payer: Self-pay | Admitting: Adult Health

## 2022-06-15 ENCOUNTER — Ambulatory Visit
Admission: RE | Admit: 2022-06-15 | Discharge: 2022-06-15 | Disposition: A | Discharge status: Home / Self Care | Payer: 59 | Source: Ambulatory Visit | Attending: Adult Health | Admitting: Adult Health

## 2022-06-15 ENCOUNTER — Ambulatory Visit
Admission: RE | Admit: 2022-06-15 | Discharge: 2022-06-15 | Disposition: A | Discharge status: Home / Self Care | Payer: No Typology Code available for payment source | Source: Ambulatory Visit | Attending: Adult Health | Admitting: Adult Health

## 2022-06-15 DIAGNOSIS — N644 Mastodynia: Secondary | ICD-10-CM

## 2022-06-15 DIAGNOSIS — N6320 Unspecified lump in the left breast, unspecified quadrant: Secondary | ICD-10-CM

## 2022-08-16 ENCOUNTER — Other Ambulatory Visit (HOSPITAL_COMMUNITY): Payer: Self-pay

## 2022-08-23 ENCOUNTER — Other Ambulatory Visit (HOSPITAL_COMMUNITY): Payer: Self-pay

## 2022-08-23 ENCOUNTER — Encounter: Payer: Self-pay | Admitting: Nurse Practitioner

## 2022-08-23 ENCOUNTER — Other Ambulatory Visit: Payer: Self-pay | Admitting: Nurse Practitioner

## 2022-08-23 DIAGNOSIS — N920 Excessive and frequent menstruation with regular cycle: Secondary | ICD-10-CM

## 2022-08-23 DIAGNOSIS — N3281 Overactive bladder: Secondary | ICD-10-CM

## 2022-08-23 NOTE — Telephone Encounter (Signed)
Last AEX 12/25/2021--nothing scheduled for 2024. Recall in place.

## 2022-08-24 ENCOUNTER — Other Ambulatory Visit (HOSPITAL_COMMUNITY): Payer: Self-pay

## 2022-08-24 ENCOUNTER — Other Ambulatory Visit: Payer: Self-pay | Admitting: Nurse Practitioner

## 2022-08-24 MED ORDER — MEDROXYPROGESTERONE ACETATE 150 MG/ML IM SUSY
150.0000 mg | PREFILLED_SYRINGE | INTRAMUSCULAR | 2 refills | Status: DC
  Filled 2022-08-24: qty 1, 90d supply, fill #0

## 2022-08-24 MED ORDER — MEDROXYPROGESTERONE ACETATE 150 MG/ML IM SUSP: 150.0000 mg | Freq: Once | INTRAMUSCULAR | 0 refills | Status: DC

## 2022-08-24 NOTE — Addendum Note (Signed)
Addended by: Judy Pimple D on: 08/24/2022 08:06 AM   Modules accepted: Orders

## 2022-08-24 NOTE — Telephone Encounter (Signed)
She self administers and had refills. Please verify that she has been doing this appropriately and she needs annual visit. Thanks.

## 2022-08-24 NOTE — Telephone Encounter (Signed)
FYI. Last depo received on 03/16/2022--next was due in 05/2022. However, pt does not use for [facility] Has BTL.   Rx sent.

## 2022-09-03 ENCOUNTER — Other Ambulatory Visit (HOSPITAL_COMMUNITY): Payer: Self-pay

## 2022-09-09 ENCOUNTER — Other Ambulatory Visit (HOSPITAL_COMMUNITY): Payer: Self-pay

## 2022-10-21 ENCOUNTER — Other Ambulatory Visit: Payer: Self-pay | Admitting: Nurse Practitioner

## 2022-10-21 DIAGNOSIS — N3281 Overactive bladder: Secondary | ICD-10-CM

## 2022-10-21 NOTE — Telephone Encounter (Signed)
Medication refill request: Ditropan  Last AEX:  12/25/21  Refill authorized: #[id number] with 1 rf pended for today

## 2022-11-22 ENCOUNTER — Other Ambulatory Visit: Payer: Self-pay | Admitting: Nurse Practitioner

## 2022-11-22 DIAGNOSIS — N3281 Overactive bladder: Secondary | ICD-10-CM

## 2022-11-29 ENCOUNTER — Other Ambulatory Visit: Payer: Self-pay | Admitting: Nurse Practitioner

## 2022-11-29 DIAGNOSIS — N920 Excessive and frequent menstruation with regular cycle: Secondary | ICD-10-CM

## 2022-11-30 MED ORDER — MEDROXYPROGESTERONE ACETATE 150 MG/ML IM SUSP: 150.0000 mg | Freq: Once | INTRAMUSCULAR | 0 refills | Status: DC

## 2022-11-30 NOTE — Telephone Encounter (Signed)
AEX 12/25/2021 Not yet scheduled.

## 2023-01-11 ENCOUNTER — Ambulatory Visit: Payer: Commercial Managed Care - HMO | Admitting: Nurse Practitioner

## 2023-01-20 ENCOUNTER — Emergency Department (HOSPITAL_COMMUNITY): Payer: BC Managed Care – PPO

## 2023-01-20 ENCOUNTER — Other Ambulatory Visit: Payer: Self-pay

## 2023-01-20 ENCOUNTER — Emergency Department (HOSPITAL_COMMUNITY)
Admission: EM | Admit: 2023-01-20 | Discharge: 2023-01-20 | Disposition: A | Discharge status: Home / Self Care | Payer: BC Managed Care – PPO | Attending: Emergency Medicine | Admitting: Emergency Medicine

## 2023-01-20 ENCOUNTER — Encounter (HOSPITAL_COMMUNITY): Payer: Self-pay

## 2023-01-20 DIAGNOSIS — Z79899 Other long term (current) drug therapy: Secondary | ICD-10-CM | POA: Diagnosis not present

## 2023-01-20 DIAGNOSIS — I10 Essential (primary) hypertension: Secondary | ICD-10-CM | POA: Insufficient documentation

## 2023-01-20 DIAGNOSIS — R0789 Other chest pain: Secondary | ICD-10-CM | POA: Insufficient documentation

## 2023-01-20 LAB — COMPREHENSIVE METABOLIC PANEL
ALT: 11 U/L (ref 0–44)
AST: 15 U/L (ref 15–41)
Albumin: 3.8 g/dL (ref 3.5–5.0)
Alkaline Phosphatase: 59 U/L (ref 38–126)
Anion gap: 15 (ref 5–15)
BUN: 13 mg/dL (ref 6–20)
CO2: 24 mmol/L (ref 22–32)
Calcium: 9.4 mg/dL (ref 8.9–10.3)
Chloride: 99 mmol/L (ref 98–111)
Creatinine, Ser: 1.17 mg/dL — ABNORMAL HIGH (ref 0.44–1.00)
GFR, Estimated: >60 mL/min (ref >60)
Glucose, Bld: 95 mg/dL (ref 70–99)
Potassium: 3.1 mmol/L — ABNORMAL LOW (ref 3.5–5.1)
Sodium: 138 mmol/L (ref 135–145)
Total Bilirubin: 0.3 mg/dL (ref 0.3–1.2)
Total Protein: 8.3 g/dL — ABNORMAL HIGH (ref 6.5–8.1)

## 2023-01-20 LAB — HCG, SERUM, QUALITATIVE: Preg, Serum: NEGATIVE

## 2023-01-20 LAB — TROPONIN I (HIGH SENSITIVITY)
Troponin I (High Sensitivity): 3 ng/L (ref <18)
Troponin I (High Sensitivity): 3 ng/L (ref <18)

## 2023-01-20 LAB — CBC
HCT: 36.8 % (ref 36.0–46.0)
Hemoglobin: 11.7 g/dL — ABNORMAL LOW (ref 12.0–15.0)
MCH: 28.3 pg (ref 26.0–34.0)
MCHC: 31.8 g/dL (ref 30.0–36.0)
MCV: 88.9 fL (ref 80.0–100.0)
Platelets: 325 10*3/uL (ref 150–400)
RBC: 4.14 MIL/uL (ref 3.87–5.11)
RDW: 12.3 % (ref 11.5–15.5)
WBC: 7.5 10*3/uL (ref 4.0–10.5)
nRBC: 0 % (ref 0.0–0.2)

## 2023-01-20 LAB — MAGNESIUM: Magnesium: 2.3 mg/dL (ref 1.7–2.4)

## 2023-01-20 LAB — CBG MONITORING, ED: Glucose-Capillary: 78 mg/dL (ref 70–99)

## 2023-01-20 LAB — LIPASE, BLOOD: Lipase: 26 U/L (ref 11–51)

## 2023-01-20 LAB — BRAIN NATRIURETIC PEPTIDE: B Natriuretic Peptide: 121.9 pg/mL — ABNORMAL HIGH (ref 0.0–100.0)

## 2023-01-20 MED ORDER — NITROGLYCERIN 0.4 MG SL SUBL
0.4000 mg | SUBLINGUAL_TABLET | SUBLINGUAL | Status: DC | PRN
  Administered 2023-01-20: 0.4 mg via SUBLINGUAL
  Filled 2023-01-20: qty 1

## 2023-01-20 NOTE — Discharge Instructions (Addendum)
As discussed, today's evaluation has been generally reassuring.  However given your history of MS and today's episode of chest pain, it is important to follow-up with your physician for appropriate ongoing care.  That conversation should include consideration of echocardiogram as a neck step for evaluation of today's presentation for chest pain.  Return here for concerning changes in your condition.

## 2023-01-20 NOTE — ED Notes (Signed)
Patient verbalizes understanding of discharge instructions. Opportunity for questioning and answers were provided. Pt discharged from ED. 

## 2023-01-20 NOTE — ED Notes (Signed)
Got patient into a gown on the monitor did EKG shown to Dr Lockwood patient is resting with call bell in reach  

## 2023-01-20 NOTE — ED Provider Notes (Signed)
[place] EMERGENCY DEPARTMENT AT [facility] Provider Note   CSN: [id number] Arrival date & time: 01/20/23  0944     History  Chief Complaint  Patient presents with   Chest Pain    Haley Mitchell is a 42 y.o. female.  HPI Presents via EMS after developing chest pain and pressure.  Patient denies history of cardiac disease, states that she is generally well, was well prior to the onset of pain.  EMS reports no hemodynamic instability en route.  No change in pain with nitroglycerin, aspirin.     Home Medications Prior to Admission medications   Medication Sig Start Date End Date Taking? Authorizing Provider  gabapentin (NEURONTIN) 600 MG tablet TAKE HALF TO 1 TABLET BY MOUTH AT BEDTIME 11/26/20   Sater, Pearletha Furl, MD  Interferon Beta-1a 44 MCG/0.5ML SOAJ INJECT 44 MCG INTO THE SKIN 3 (THREE) TIMES A WEEK. 08/27/20 09/07/21  Quentin Angst, MD  levocetirizine (XYZAL) 5 MG tablet Take 1 tablet (5 mg total) by mouth every evening. 11/12/19   Lomax, Amy, NP  medroxyPROGESTERone (DEPO-PROVERA) 150 MG/ML injection Inject 1 mL (150 mg total) into the muscle once for 1 dose. 11/30/22 11/30/22  Olivia Mackie, NP  oxybutynin (DITROPAN) 5 MG tablet TAKE 2 TABLETS BY MOUTH AT BEDTIME 10/21/22   Wyline Beady A, NP  Armodafinil 200 MG TABS One po qAM 09/22/18 04/09/19  Sater, Pearletha Furl, MD      Allergies    Shellfish allergy    Review of Systems   Review of Systems  All other systems reviewed and are negative.   Physical Exam Updated Vital Signs BP 134/75 (BP Location: Right Arm)   Pulse 60   Temp 98 F (36.7 C) (Oral)   Resp 16   Ht 5\' 1"  (1.549 m)   Wt 113.9 kg   SpO2 100%   BMI 47.43 kg/m  Physical Exam Vitals and nursing note reviewed.  Constitutional:      General: She is not in acute distress.    Appearance: She is well-developed. She is obese.  HENT:     Head: Normocephalic and atraumatic.  Eyes:     Conjunctiva/sclera: Conjunctivae normal.   Cardiovascular:     Rate and Rhythm: Normal rate and regular rhythm.  Pulmonary:     Effort: Pulmonary effort is normal. No respiratory distress.     Breath sounds: Normal breath sounds. No stridor.  Abdominal:     General: There is no distension.  Skin:    General: Skin is warm and dry.  Neurological:     Mental Status: She is alert and oriented to person, place, and time.     Cranial Nerves: No cranial nerve deficit.  Psychiatric:        Mood and Affect: Mood normal.     ED Results / Procedures / Treatments   Labs (all labs ordered are listed, but only abnormal results are displayed) Labs Reviewed  CBC - Abnormal; Notable for the following components:      Result Value   Hemoglobin 11.7 (*)    All other components within normal limits  COMPREHENSIVE METABOLIC PANEL - Abnormal; Notable for the following components:   Potassium 3.1 (*)    Creatinine, Ser 1.17 (*)    Total Protein 8.3 (*)    All other components within normal limits  BRAIN NATRIURETIC PEPTIDE - Abnormal; Notable for the following components:   B Natriuretic Peptide 121.9 (*)    All other components  within normal limits  HCG, SERUM, QUALITATIVE  MAGNESIUM  LIPASE, BLOOD  CBG MONITORING, ED  TROPONIN I (HIGH SENSITIVITY)  TROPONIN I (HIGH SENSITIVITY)    EKG EKG Interpretation Date/Time:  Thursday January 20 2023 09:58:01 EDT Ventricular Rate:  62 PR Interval:  168 QRS Duration:  93 QT Interval:  412 QTC Calculation: 419 R Axis:   43  Text Interpretation: Sinus rhythm Probable left atrial enlargement Artifact Otherwise within normal limits Confirmed by Gerhard Munch [phone]) on 01/20/2023 9:59:34 AM  Radiology DG Chest Portable 1 View  Result Date: 01/20/2023 CLINICAL DATA:  Pain EXAM: PORTABLE CHEST 1 VIEW COMPARISON:  None Available. FINDINGS: No consolidation, pneumothorax or effusion. Normal cardiopericardial silhouette without edema. Overlapping cardiac leads. IMPRESSION: No acute  cardiopulmonary disease. Electronically Signed   By: Karen Kays M.D.   On: 01/20/2023 11:07    Procedures Procedures    Medications Ordered in ED Medications  nitroGLYCERIN (NITROSTAT) SL tablet 0.4 mg (0.4 mg Sublingual Given 01/20/23 1016)    ED Course/ Medical Decision Making/ A&P             HEART Score: 1                Medical Decision Making Adult female with MS, hypertension, morbid obesity presents with chest pain.  Broad differential including ACS, anemia, hepatobiliary aortic gastroesophageal dysfunction considered. EMS rhythm strip with sinus rhythm, rate 71, nonischemic. Cardiac monitor here 70 sinus normal Pulse ox 100% room air normal   Amount and/or Complexity of Data Reviewed Independent Historian: EMS External Data Reviewed: notes. Labs: ordered. Decision-making details documented in ED Course. Radiology: ordered and independent interpretation performed. Decision-making details documented in ED Course. ECG/medicine tests: ordered and independent interpretation performed. Decision-making details documented in ED Course.  Risk Prescription drug management. Decision regarding hospitalization. Diagnosis or treatment significantly limited by social determinants of health.   1:20 PM Patient notes that the persistent discomfort has essentially resolved she has occasional sharp twinges of pain, but has no ongoing persistent pressure.  She is awake and alert, speaking on telephone, now accompanied by her husband.  I have reviewed her labs, ECG, blood pressure has improved.  Heart score of 1, 2 normal troponin, nonischemic EKG, patient appropriate for close outpatient follow-up.  Absent focal neuro complaints, findings, little evidence for dissection.  No evidence for pneumonia, bacteremia, sepsis.  She does have mildly elevated BNP, but was also hypertensive initially, and this is also appropriate for close outpatient follow-up including consideration of echocardiogram  with her primary care physician.  She has a physician with whom she can follow-up, she confirms.        Final Clinical Impression(s) / ED Diagnoses Final diagnoses:  Atypical chest pain     Gerhard Munch, MD 01/20/23 1320

## 2023-02-01 ENCOUNTER — Ambulatory Visit (INDEPENDENT_AMBULATORY_CARE_PROVIDER_SITE_OTHER): Payer: BC Managed Care – PPO | Admitting: *Deleted

## 2023-02-01 DIAGNOSIS — Z3042 Encounter for surveillance of injectable contraceptive: Secondary | ICD-10-CM | POA: Diagnosis not present

## 2023-02-01 MED ORDER — MEDROXYPROGESTERONE ACETATE 150 MG/ML IM SUSP
150.00 mg | Freq: Once | INTRAMUSCULAR | Status: AC
Start: 2023-02-01 — End: 2023-02-01
  Administered 2023-02-01: 150 mg via INTRAMUSCULAR

## 2023-02-01 NOTE — Progress Notes (Signed)
Patient came in for depo provera 150mg , she has been getting her injections from her pcp. Last depo per patient was 10-31-22. Pt has BTL. Patient tolerated injection well.

## 2023-02-16 ENCOUNTER — Other Ambulatory Visit: Payer: Self-pay | Admitting: Nurse Practitioner

## 2023-02-16 DIAGNOSIS — N3281 Overactive bladder: Secondary | ICD-10-CM

## 2023-02-16 NOTE — Telephone Encounter (Signed)
Medication refill request: ditropan 5mg  Last AEX:  12-25-21 Next AEX: 03-15-23 Last MMG (if hormonal medication request): n/a Refill authorized: please approve if appropriate

## 2023-03-15 ENCOUNTER — Ambulatory Visit: Payer: Commercial Managed Care - HMO | Admitting: Nurse Practitioner

## 2023-03-30 ENCOUNTER — Other Ambulatory Visit: Payer: Self-pay | Admitting: Adult Health

## 2023-03-30 DIAGNOSIS — Z1231 Encounter for screening mammogram for malignant neoplasm of breast: Secondary | ICD-10-CM

## 2023-04-14 ENCOUNTER — Other Ambulatory Visit: Payer: Self-pay | Admitting: Nurse Practitioner

## 2023-04-14 DIAGNOSIS — N920 Excessive and frequent menstruation with regular cycle: Secondary | ICD-10-CM

## 2023-04-15 NOTE — Telephone Encounter (Signed)
Med refill request: medroxyprogesterone 150mg  Last AEX: 12/25/21 Next AEX: none scheduled Last MMG (if hormonal med) 06/15/22 Refill denied and sent to provider, needs appointment.

## 2023-04-19 ENCOUNTER — Ambulatory Visit: Payer: BC Managed Care – PPO

## 2023-04-25 ENCOUNTER — Encounter: Payer: Self-pay | Admitting: Nurse Practitioner

## 2023-04-27 ENCOUNTER — Other Ambulatory Visit: Payer: Self-pay

## 2023-04-27 DIAGNOSIS — N920 Excessive and frequent menstruation with regular cycle: Secondary | ICD-10-CM

## 2023-04-27 MED ORDER — MEDROXYPROGESTERONE ACETATE 150 MG/ML IM SUSP
150.0000 mg | Freq: Once | INTRAMUSCULAR | 0 refills | Status: DC
Start: 2023-04-27 — End: 2023-07-14

## 2023-04-27 NOTE — Telephone Encounter (Signed)
Rx refill request sent by alternate encounter dated 04/27/2023. Encounter closed.

## 2023-04-27 NOTE — Telephone Encounter (Signed)
Sending refill for depo inj for 05/02/23

## 2023-04-29 ENCOUNTER — Ambulatory Visit
Admission: RE | Admit: 2023-04-29 | Discharge: 2023-04-29 | Disposition: A | Discharge status: Home / Self Care | Payer: Managed Care, Other (non HMO) | Source: Ambulatory Visit

## 2023-04-29 DIAGNOSIS — Z1231 Encounter for screening mammogram for malignant neoplasm of breast: Secondary | ICD-10-CM

## 2023-05-02 ENCOUNTER — Ambulatory Visit (INDEPENDENT_AMBULATORY_CARE_PROVIDER_SITE_OTHER): Payer: Managed Care, Other (non HMO)

## 2023-05-02 ENCOUNTER — Ambulatory Visit: Payer: BC Managed Care – PPO

## 2023-05-02 DIAGNOSIS — N920 Excessive and frequent menstruation with regular cycle: Secondary | ICD-10-CM | POA: Diagnosis not present

## 2023-05-02 MED ORDER — MEDROXYPROGESTERONE ACETATE 150 MG/ML IM SUSY
150.0000 mg | PREFILLED_SYRINGE | Freq: Once | INTRAMUSCULAR | Status: AC
Start: 2023-05-02 — End: 2023-05-02
  Administered 2023-05-02: 150 mg via INTRAMUSCULAR

## 2023-05-16 ENCOUNTER — Other Ambulatory Visit: Payer: Self-pay | Admitting: Nurse Practitioner

## 2023-05-16 DIAGNOSIS — N3281 Overactive bladder: Secondary | ICD-10-CM

## 2023-05-17 NOTE — Telephone Encounter (Signed)
Medication refill request: oxybutnin  Last AEX:  12/25/21 Next AEX: 05/24/23 Last MMG (if hormonal medication request): n/a  Refill authorized: #60 with 0 rf to get her to her aex

## 2023-05-24 ENCOUNTER — Ambulatory Visit (INDEPENDENT_AMBULATORY_CARE_PROVIDER_SITE_OTHER): Payer: Managed Care, Other (non HMO) | Admitting: Nurse Practitioner

## 2023-05-24 ENCOUNTER — Encounter: Payer: Self-pay | Admitting: Nurse Practitioner

## 2023-05-24 VITALS — BP 110/86 | HR 83 | Ht 63.0 in | Wt 246.0 lb

## 2023-05-24 DIAGNOSIS — N920 Excessive and frequent menstruation with regular cycle: Secondary | ICD-10-CM | POA: Diagnosis not present

## 2023-05-24 DIAGNOSIS — N3281 Overactive bladder: Secondary | ICD-10-CM

## 2023-05-24 DIAGNOSIS — Z01419 Encounter for gynecological examination (general) (routine) without abnormal findings: Secondary | ICD-10-CM

## 2023-05-24 MED ORDER — OXYBUTYNIN CHLORIDE 5 MG PO TABS: 10.0000 mg | ORAL_TABLET | Freq: Every day | ORAL | 1 refills | Status: DC

## 2023-05-24 NOTE — Progress Notes (Signed)
Haley Mitchell 12/14/1980 [id number]   History:  42 y.o. G3P3 presents for annual exam. Depo for menorrhagia. Reports abnormal pap in the past that did not require intervention, never HPV positive. MS managed by neurology. Has OAB from MS medication, taking Oxybutinin 10 mg daily with good management.  Gynecologic History Patient's last menstrual period was 04/17/2023 (exact date). Period Cycle (Days):  (56) Period Duration (Days): 3-4 Menstrual Flow:  (moderate to light) Menstrual Control: Maxi pad Dysmenorrhea: (!) Mild Dysmenorrhea Symptoms: Cramping Contraception/Family planning: Depo-Provera injections and tubal ligation Sexually active: Yes  Health Maintenance Last Pap: 12/25/2021. Results were: Normal neg HPV, 5-year repeat Last mammogram: 04/29/2023. Results were: Normal Last colonoscopy: Not indicated Last Dexa: Not indicated  Past medical history, past surgical history, family history and social history were all reviewed and documented in the EPIC chart. Married. 11 yo son, 67 and 21 yo daughters. 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   ROS:  A ROS was performed and pertinent positives and negatives are included.  Exam:  Vitals:   05/24/23 1535  BP: 110/86  Pulse: 83  SpO2: 98%  Weight: 246 lb (111.6 kg)  Height: 5\' 3"  (1.6 m)    Body mass index is 43.58 kg/m.  General appearance:  Normal Thyroid:  Symmetrical, normal in size, without palpable masses or nodularity. Respiratory  Auscultation:  Clear without wheezing or rhonchi Cardiovascular  Auscultation:  Regular rate, without rubs, murmurs or gallops  Edema/varicosities:  Not grossly evident Abdominal  Soft,nontender, without masses, guarding or rebound.  Liver/spleen:  No organomegaly noted  Hernia:  None appreciated  Skin  Inspection:  Grossly normal Breasts: Examined lying and sitting.   Right: Without masses, retractions, nipple discharge or axillary  adenopathy.   Left: Without masses, retractions, nipple discharge or axillary adenopathy. Pelvic: External genitalia:  no lesions              Urethra:  normal appearing urethra with no masses, tenderness or lesions              Bartholins and Skenes: normal                 Vagina: normal appearing vagina with normal color and discharge, no lesions              Cervix: no lesions Bimanual Exam:  Uterus:  no masses or tenderness              Adnexa: no mass, fullness, tenderness              Rectovaginal: Deferred              Anus:  normal, no lesions   Patient informed chaperone available to be present for breast and pelvic exam. Patient has requested no chaperone to be present. Patient has been advised what will be completed during breast and pelvic exam.   Assessment/Plan:  42 y.o. G3P3 for annual exam.   Well female exam with routine gynecological exam - Education provided on SBEs, importance of preventative screenings, current guidelines, high calcium diet, regular exercise, and multivitamin daily.  Labs with PCP.   Menorrhagia with regular cycle - Plan: medroxyPROGESTERone (DEPO-PROVERA) injection 150 mg every 90 days. Last dose 05/02/23.   Overactive bladder - Plan: oxybutynin (DITROPAN) 5 MG tablet (2 tablets) daily. Her MS medications cause this and was previously prescribed 10 mg daily. Will try to decrease to 5 mg daily.   Screening for cervical cancer - Abnormal  pap >20 years ago, no intervention required. Will repeat at 5-year interval per guidelines.   Screening for breast cancer - Normal mammogram history. Continue annual screenings. Normal breast exam today.  Return in about 1 year (around 05/23/2024) for Annual.   Olivia Mackie DNP, 3:57 PM 05/24/2023

## 2023-07-14 ENCOUNTER — Other Ambulatory Visit: Payer: Self-pay

## 2023-07-14 DIAGNOSIS — N920 Excessive and frequent menstruation with regular cycle: Secondary | ICD-10-CM

## 2023-07-14 MED ORDER — MEDROXYPROGESTERONE ACETATE 150 MG/ML IM SUSP
150.0000 mg | Freq: Once | INTRAMUSCULAR | 0 refills | Status: DC
Start: 2023-07-14 — End: 2023-08-25

## 2023-07-14 NOTE — Telephone Encounter (Signed)
Depo inj refill Appt 07/25/23

## 2023-07-20 ENCOUNTER — Other Ambulatory Visit: Payer: Self-pay | Admitting: Radiology

## 2023-07-20 DIAGNOSIS — N920 Excessive and frequent menstruation with regular cycle: Secondary | ICD-10-CM

## 2023-07-21 ENCOUNTER — Other Ambulatory Visit: Payer: Self-pay | Admitting: Nurse Practitioner

## 2023-07-21 DIAGNOSIS — N920 Excessive and frequent menstruation with regular cycle: Secondary | ICD-10-CM

## 2023-07-21 NOTE — Telephone Encounter (Signed)
Med refill request: medroxyprogesterone 150 mg. Refill denied.  Duplicate request.  RF sent 07/14/23 to [facility] [place].  Sent to provider for review.

## 2023-07-25 ENCOUNTER — Ambulatory Visit (INDEPENDENT_AMBULATORY_CARE_PROVIDER_SITE_OTHER): Payer: Managed Care, Other (non HMO)

## 2023-07-25 DIAGNOSIS — N920 Excessive and frequent menstruation with regular cycle: Secondary | ICD-10-CM

## 2023-07-25 MED ORDER — MEDROXYPROGESTERONE ACETATE 150 MG/ML IM SUSP
150.0000 mg | Freq: Once | INTRAMUSCULAR | Status: AC
Start: 2023-07-25 — End: 2023-07-25
  Administered 2023-07-25: 150 mg via INTRAMUSCULAR

## 2023-07-25 NOTE — Progress Notes (Signed)
 Patient is in office today for a nurse visit for Birth Control Injection. Patient Injection was given in the  Left upper quad. gluteus. Patient tolerated injection well.

## 2023-08-25 ENCOUNTER — Other Ambulatory Visit: Payer: Self-pay | Admitting: Radiology

## 2023-08-25 DIAGNOSIS — N920 Excessive and frequent menstruation with regular cycle: Secondary | ICD-10-CM

## 2023-08-25 NOTE — Telephone Encounter (Signed)
Depo inj refill  Appt 3/17

## 2023-09-09 ENCOUNTER — Other Ambulatory Visit: Payer: Self-pay

## 2023-09-09 DIAGNOSIS — N3281 Overactive bladder: Secondary | ICD-10-CM

## 2023-09-09 DIAGNOSIS — N920 Excessive and frequent menstruation with regular cycle: Secondary | ICD-10-CM

## 2023-09-09 NOTE — Telephone Encounter (Signed)
[facility] to [facility]. Med refill request: oxybutynin chloride 5 mg Med refill request:  medroxyprogesterone injection 1 ml Last AEX: 05/24/23 Next AEX: 05/24/24 Last MMG (if hormonal med) 04/29/23 normal BI-RADS 1 Refill authorized: oxybutynin chloride 5 mg #90 with 2 refills and medroxyprogesterone injection 1 ml #1 with 2 refills.  Sent to provider for approval or denial as appropriate.

## 2023-09-12 MED ORDER — OXYBUTYNIN CHLORIDE 5 MG PO TABS: 10.0000 mg | ORAL_TABLET | Freq: Every day | ORAL | 2 refills | Status: DC

## 2023-09-12 MED ORDER — MEDROXYPROGESTERONE ACETATE 150 MG/ML IM SUSP
150.0000 mg | Freq: Once | INTRAMUSCULAR | 2 refills | Status: DC
Start: 2023-09-12 — End: 2024-03-23

## 2023-09-26 ENCOUNTER — Other Ambulatory Visit: Payer: Self-pay

## 2023-09-26 DIAGNOSIS — N920 Excessive and frequent menstruation with regular cycle: Secondary | ICD-10-CM

## 2023-09-26 NOTE — Telephone Encounter (Signed)
 Med refill request: Depo Provera  Last AEX: 05/24/2023-TW Next AEX: 05/24/2024-TW Last MMG (if hormonal med): 04/29/2023-WNL Refill authorized: rx pend.   Per EMR investigation: rx for depo was sent on 09/12/2023 for #1 with 2 refills. However, now says that rx has ended.   [facility] and she reported that they did receive the one sent on 09/12/2023 and it was mailed to her on 09/19/2023. She stated that the request must have generated prior to the received rx and can disregard this one for now.   Rx denied. Routing to provider for final review and encounter closed.

## 2023-10-07 NOTE — Progress Notes (Signed)
Patient is in office today for a nurse visit for Birth Control Injection. Patient Injection was given in the  Right upper quad. gluteus. Patient tolerated injection well.

## 2023-10-10 ENCOUNTER — Ambulatory Visit (INDEPENDENT_AMBULATORY_CARE_PROVIDER_SITE_OTHER): Payer: Managed Care, Other (non HMO)

## 2023-10-10 DIAGNOSIS — Z3042 Encounter for surveillance of injectable contraceptive: Secondary | ICD-10-CM | POA: Diagnosis not present

## 2023-10-10 DIAGNOSIS — N920 Excessive and frequent menstruation with regular cycle: Secondary | ICD-10-CM | POA: Diagnosis not present

## 2023-10-10 MED ORDER — MEDROXYPROGESTERONE ACETATE 150 MG/ML IM SUSY
150.0000 mg | PREFILLED_SYRINGE | Freq: Once | INTRAMUSCULAR | Status: AC
Start: 2023-10-10 — End: 2023-10-10
  Administered 2023-10-10: 150 mg via INTRAMUSCULAR

## 2023-10-23 ENCOUNTER — Other Ambulatory Visit: Payer: Self-pay | Admitting: Nurse Practitioner

## 2023-10-23 DIAGNOSIS — N3281 Overactive bladder: Secondary | ICD-10-CM

## 2023-10-24 NOTE — Telephone Encounter (Signed)
 Medication refill request: Oxybutynin 5mg  Last AEX:  05-24-23 Next AEX: 05-24-24 Last MMG (if hormonal medication request): n/a Refill authorized: rx [facility]. [facility]. I called patient & left her a message to call us back to see which pharmacy she is using.

## 2023-10-26 NOTE — Telephone Encounter (Signed)
 Left message for patient to call us back.

## 2023-11-04 NOTE — Telephone Encounter (Signed)
 No callback from patient. This rx is denied. Pharmacy note placed for patient to call office if she wants to use local pharmacy & that rx was previously [facility] to [facility].  Routed to [employer]

## 2023-12-26 ENCOUNTER — Ambulatory Visit

## 2023-12-27 ENCOUNTER — Ambulatory Visit (INDEPENDENT_AMBULATORY_CARE_PROVIDER_SITE_OTHER)

## 2023-12-27 DIAGNOSIS — N920 Excessive and frequent menstruation with regular cycle: Secondary | ICD-10-CM

## 2023-12-27 MED ORDER — MEDROXYPROGESTERONE ACETATE 150 MG/ML IM SUSP
150.0000 mg | Freq: Once | INTRAMUSCULAR | Status: AC
  Administered 2023-12-27: 150 mg via INTRAMUSCULAR

## 2023-12-27 NOTE — Progress Notes (Signed)
 Medroxyprogesterone  Acetate 150 mg injection given IM LUOQ.  Patient tolerated injection well.  Next injection is due 03/13/24-03/27/24.  AEX due 04/2024.

## 2024-03-23 ENCOUNTER — Other Ambulatory Visit: Payer: Self-pay | Admitting: Nurse Practitioner

## 2024-03-23 DIAGNOSIS — N920 Excessive and frequent menstruation with regular cycle: Secondary | ICD-10-CM

## 2024-03-23 NOTE — Telephone Encounter (Signed)
 Med refill request: Depo-Provera   Last AEX: 05/23/24 Next AEX: 05/24/24 Last MMG (if hormonal med) 04/29/23 BIRADS Cat 1 neg  Refill authorized: last rx 09/12/23 # with 2 refills. Please approve or deny

## 2024-03-27 ENCOUNTER — Ambulatory Visit (INDEPENDENT_AMBULATORY_CARE_PROVIDER_SITE_OTHER)

## 2024-03-27 DIAGNOSIS — Z3042 Encounter for surveillance of injectable contraceptive: Secondary | ICD-10-CM | POA: Diagnosis not present

## 2024-03-27 MED ORDER — MEDROXYPROGESTERONE ACETATE 150 MG/ML IM SUSY
150.0000 mg | PREFILLED_SYRINGE | Freq: Once | INTRAMUSCULAR | Status: AC
  Administered 2024-03-27: 150 mg via INTRAMUSCULAR

## 2024-04-06 ENCOUNTER — Other Ambulatory Visit: Payer: Self-pay | Admitting: Nurse Practitioner

## 2024-04-06 DIAGNOSIS — Z1231 Encounter for screening mammogram for malignant neoplasm of breast: Secondary | ICD-10-CM

## 2024-04-09 ENCOUNTER — Ambulatory Visit: Admitting: Podiatry

## 2024-04-30 ENCOUNTER — Encounter

## 2024-04-30 DIAGNOSIS — Z1231 Encounter for screening mammogram for malignant neoplasm of breast: Secondary | ICD-10-CM

## 2024-05-23 NOTE — Progress Notes (Unsigned)
 Haley Mitchell May 06, 1981 [id number]   History:  43 y.o. G3P3 presents for annual exam. Depo for menorrhagia. Reports abnormal pap in the past that did not require intervention, never HPV positive. MS managed by neurology. OAB - was doing Ditropan  for a while but only a little improvement. Would like to try alternative. Has been off it for a while and experiences urinary leakage frequently when she does not get to the bathroom in time.   Gynecologic History No LMP recorded. Patient has had an injection.   Contraception/Family planning: Depo-Provera  injections and tubal ligation Sexually active: Yes  Health Maintenance Last Pap: 12/25/2021. Results were: Normal neg HPV Last mammogram: 04/29/2023. Results were: Normal Last colonoscopy: Not indicated Last Dexa: Not indicated     05/24/2024    8:07 AM  Depression screen PHQ 2/9  Decreased Interest 1  Down, Depressed, Hopeless 1  PHQ - 2 Score 2  Altered sleeping 1  Tired, decreased energy 3  Change in appetite 0  Feeling bad or failure about yourself  0  Trouble concentrating 1  Moving slowly or fidgety/restless 0  Suicidal thoughts 0  PHQ-9 Score 7  Difficult doing work/chores Not difficult at all     Past medical history, past surgical history, family history and social history were all reviewed and documented in the EPIC chart. Married. 73 yo son, 41 and 60 yo daughters. 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   ROS:  A ROS was performed and pertinent positives and negatives are included.  Exam:  Vitals:   05/24/24 0802  BP: 110/72  Pulse: 75  SpO2: 99%  Weight: 261 lb (118.4 kg)  Height: 5' 2 (1.575 m)     Body mass index is 47.74 kg/m.  General appearance:  Normal Thyroid :  Symmetrical, normal in size, without palpable masses or nodularity. Respiratory  Auscultation:  Clear without wheezing or rhonchi Cardiovascular  Auscultation:  Regular rate, without rubs, murmurs or  gallops  Edema/varicosities:  Not grossly evident Abdominal  Soft,nontender, without masses, guarding or rebound.  Liver/spleen:  No organomegaly noted  Hernia:  None appreciated  Skin  Inspection:  Grossly normal Breasts: Examined lying and sitting.   Right: Without masses, retractions, nipple discharge or axillary adenopathy.   Left: Without masses, retractions, nipple discharge or axillary adenopathy. Pelvic: External genitalia:  no lesions              Urethra:  normal appearing urethra with no masses, tenderness or lesions              Bartholins and Skenes: normal                 Vagina: normal appearing vagina with normal color and discharge, no lesions              Cervix: no lesions Bimanual Exam:  Uterus:  no masses or tenderness              Adnexa: no mass, fullness, tenderness              Rectovaginal: Deferred              Anus:  normal, no lesions  Zada Louder, CMA present a chaperone.   Assessment/Plan:  43 y.o. G3P3 for annual exam.   Well female exam with routine gynecological exam - Education provided on SBEs, importance of preventative screenings, current guidelines, high calcium diet, regular exercise, and multivitamin daily.  Labs with PCP.   Menorrhagia  with regular cycle - Depo with good management.   OAB (overactive bladder) - Plan: mirabegron ER (MYRBETRIQ) 25 MG TB24 tablet daily. Ditropan  not effective. May increase to 50 mg after 4-8 weeks.   Screening for cervical cancer - Abnormal pap >20 years ago, no intervention required. Will repeat at 5-year interval per guidelines.   Screening for breast cancer - Normal mammogram history. Continue annual screenings. Mammogram scheduled tomorrow. Normal breast exam today.  Return in about 1 year (around 05/24/2025) for Annual.    Annabella DELENA Shutter DNP, 8:23 AM 05/24/2024

## 2024-05-24 ENCOUNTER — Encounter: Payer: Self-pay | Admitting: Nurse Practitioner

## 2024-05-24 ENCOUNTER — Ambulatory Visit (INDEPENDENT_AMBULATORY_CARE_PROVIDER_SITE_OTHER): Payer: PRIVATE HEALTH INSURANCE | Admitting: Nurse Practitioner

## 2024-05-24 VITALS — BP 110/72 | HR 75 | Ht 62.0 in | Wt 261.0 lb

## 2024-05-24 DIAGNOSIS — Z01419 Encounter for gynecological examination (general) (routine) without abnormal findings: Secondary | ICD-10-CM

## 2024-05-24 DIAGNOSIS — Z1331 Encounter for screening for depression: Secondary | ICD-10-CM

## 2024-05-24 DIAGNOSIS — N3281 Overactive bladder: Secondary | ICD-10-CM

## 2024-05-24 DIAGNOSIS — N920 Excessive and frequent menstruation with regular cycle: Secondary | ICD-10-CM | POA: Diagnosis not present

## 2024-05-24 MED ORDER — MIRABEGRON ER 25 MG PO TB24: 25.0000 mg | ORAL_TABLET | Freq: Every day | ORAL | 2 refills | Status: AC

## 2024-06-12 ENCOUNTER — Ambulatory Visit

## 2024-06-19 ENCOUNTER — Ambulatory Visit (INDEPENDENT_AMBULATORY_CARE_PROVIDER_SITE_OTHER): Payer: PRIVATE HEALTH INSURANCE

## 2024-06-19 DIAGNOSIS — Z3042 Encounter for surveillance of injectable contraceptive: Secondary | ICD-10-CM | POA: Diagnosis not present

## 2024-06-19 MED ORDER — MEDROXYPROGESTERONE ACETATE 150 MG/ML IM SUSP
150.0000 mg | Freq: Once | INTRAMUSCULAR | Status: AC
  Administered 2024-06-19: 150 mg via INTRAMUSCULAR

## 2024-09-11 ENCOUNTER — Ambulatory Visit: Payer: PRIVATE HEALTH INSURANCE
